# Patient Record
Sex: Female | Born: 1980 | Race: Black or African American | Hispanic: No | Marital: Single | Smoking: Never smoker
Health system: Southern US, Community
[De-identification: ages and names within clinical notes are randomized; demographics above are authoritative.]

## PROBLEM LIST (undated history)

## (undated) DIAGNOSIS — G43909 Migraine, unspecified, not intractable, without status migrainosus: Secondary | ICD-10-CM

## (undated) DIAGNOSIS — A599 Trichomoniasis, unspecified: Secondary | ICD-10-CM

## (undated) DIAGNOSIS — Z8619 Personal history of other infectious and parasitic diseases: Secondary | ICD-10-CM

## (undated) DIAGNOSIS — N3090 Cystitis, unspecified without hematuria: Secondary | ICD-10-CM

## (undated) DIAGNOSIS — B977 Papillomavirus as the cause of diseases classified elsewhere: Secondary | ICD-10-CM

## (undated) HISTORY — PX: WISDOM TOOTH EXTRACTION: SHX21

## (undated) HISTORY — DX: Cystitis, unspecified without hematuria: N30.90

## (undated) HISTORY — PX: DILATION AND CURETTAGE OF UTERUS: SHX78

## (undated) HISTORY — DX: Personal history of other infectious and parasitic diseases: Z86.19

## (undated) HISTORY — DX: Migraine, unspecified, not intractable, without status migrainosus: G43.909

## (undated) HISTORY — PX: CERVICAL CONE BIOPSY: SUR198

---

## 2001-10-02 ENCOUNTER — Encounter: Payer: Self-pay | Admitting: Emergency Medicine

## 2001-10-02 ENCOUNTER — Emergency Department (HOSPITAL_COMMUNITY): Admission: EM | Admit: 2001-10-02 | Discharge: 2001-10-02 | Payer: Self-pay | Admitting: Emergency Medicine

## 2001-10-28 ENCOUNTER — Emergency Department (HOSPITAL_COMMUNITY): Admission: EM | Admit: 2001-10-28 | Discharge: 2001-10-28 | Payer: Self-pay | Admitting: *Deleted

## 2003-02-28 ENCOUNTER — Emergency Department (HOSPITAL_COMMUNITY): Admission: AD | Admit: 2003-02-28 | Discharge: 2003-02-28 | Payer: Self-pay | Admitting: Family Medicine

## 2004-07-09 ENCOUNTER — Emergency Department (HOSPITAL_COMMUNITY): Admission: EM | Admit: 2004-07-09 | Discharge: 2004-07-09 | Payer: Self-pay | Admitting: Family Medicine

## 2004-10-05 ENCOUNTER — Inpatient Hospital Stay (HOSPITAL_COMMUNITY): Admission: AD | Admit: 2004-10-05 | Discharge: 2004-10-05 | Payer: Self-pay | Admitting: *Deleted

## 2004-10-06 ENCOUNTER — Encounter (INDEPENDENT_AMBULATORY_CARE_PROVIDER_SITE_OTHER): Payer: Self-pay | Admitting: Specialist

## 2004-10-06 ENCOUNTER — Ambulatory Visit (HOSPITAL_COMMUNITY): Admission: AD | Admit: 2004-10-06 | Discharge: 2004-10-06 | Payer: Self-pay | Admitting: Obstetrics and Gynecology

## 2004-12-14 ENCOUNTER — Ambulatory Visit: Payer: Self-pay | Admitting: Internal Medicine

## 2004-12-20 ENCOUNTER — Ambulatory Visit: Payer: Self-pay | Admitting: Internal Medicine

## 2004-12-22 ENCOUNTER — Encounter: Admission: RE | Admit: 2004-12-22 | Discharge: 2004-12-22 | Payer: Self-pay | Admitting: Internal Medicine

## 2004-12-29 ENCOUNTER — Ambulatory Visit: Payer: Self-pay | Admitting: Internal Medicine

## 2005-01-22 ENCOUNTER — Emergency Department (HOSPITAL_COMMUNITY): Admission: EM | Admit: 2005-01-22 | Discharge: 2005-01-22 | Payer: Self-pay | Admitting: Emergency Medicine

## 2005-02-08 ENCOUNTER — Ambulatory Visit (HOSPITAL_BASED_OUTPATIENT_CLINIC_OR_DEPARTMENT_OTHER): Admission: RE | Admit: 2005-02-08 | Discharge: 2005-02-08 | Payer: Self-pay | Admitting: Internal Medicine

## 2005-02-21 ENCOUNTER — Ambulatory Visit: Payer: Self-pay | Admitting: Pulmonary Disease

## 2005-03-09 ENCOUNTER — Ambulatory Visit: Payer: Self-pay | Admitting: Internal Medicine

## 2005-04-01 ENCOUNTER — Encounter: Admission: RE | Admit: 2005-04-01 | Discharge: 2005-04-01 | Payer: Self-pay | Admitting: Internal Medicine

## 2005-04-26 ENCOUNTER — Ambulatory Visit: Payer: Self-pay | Admitting: Gastroenterology

## 2005-05-18 ENCOUNTER — Emergency Department (HOSPITAL_COMMUNITY): Admission: EM | Admit: 2005-05-18 | Discharge: 2005-05-18 | Payer: Self-pay | Admitting: Family Medicine

## 2005-06-13 ENCOUNTER — Ambulatory Visit: Payer: Self-pay | Admitting: Gastroenterology

## 2005-07-08 ENCOUNTER — Ambulatory Visit: Payer: Self-pay | Admitting: Gastroenterology

## 2005-07-29 ENCOUNTER — Ambulatory Visit: Payer: Self-pay | Admitting: Internal Medicine

## 2006-03-31 ENCOUNTER — Other Ambulatory Visit: Admission: RE | Admit: 2006-03-31 | Discharge: 2006-03-31 | Payer: Self-pay | Admitting: Family Medicine

## 2006-03-31 ENCOUNTER — Ambulatory Visit: Payer: Self-pay | Admitting: Family Medicine

## 2006-04-04 ENCOUNTER — Telehealth: Payer: Self-pay | Admitting: Family Medicine

## 2006-04-10 ENCOUNTER — Telehealth (INDEPENDENT_AMBULATORY_CARE_PROVIDER_SITE_OTHER): Payer: Self-pay | Admitting: *Deleted

## 2006-04-11 ENCOUNTER — Telehealth: Payer: Self-pay | Admitting: Family Medicine

## 2006-04-13 ENCOUNTER — Telehealth (INDEPENDENT_AMBULATORY_CARE_PROVIDER_SITE_OTHER): Payer: Self-pay | Admitting: *Deleted

## 2006-05-18 ENCOUNTER — Ambulatory Visit: Payer: Self-pay | Admitting: Family Medicine

## 2006-05-18 DIAGNOSIS — L738 Other specified follicular disorders: Secondary | ICD-10-CM

## 2006-05-19 DIAGNOSIS — L708 Other acne: Secondary | ICD-10-CM

## 2006-09-22 ENCOUNTER — Ambulatory Visit: Payer: Self-pay | Admitting: Family Medicine

## 2006-09-22 DIAGNOSIS — M25569 Pain in unspecified knee: Secondary | ICD-10-CM

## 2007-05-14 ENCOUNTER — Ambulatory Visit: Payer: Self-pay | Admitting: Family Medicine

## 2007-05-14 DIAGNOSIS — J309 Allergic rhinitis, unspecified: Secondary | ICD-10-CM | POA: Insufficient documentation

## 2007-05-14 DIAGNOSIS — R21 Rash and other nonspecific skin eruption: Secondary | ICD-10-CM | POA: Insufficient documentation

## 2007-06-07 ENCOUNTER — Telehealth: Payer: Self-pay | Admitting: Family Medicine

## 2007-06-07 ENCOUNTER — Ambulatory Visit: Payer: Self-pay | Admitting: Family Medicine

## 2007-06-07 DIAGNOSIS — R42 Dizziness and giddiness: Secondary | ICD-10-CM

## 2007-06-07 DIAGNOSIS — J45909 Unspecified asthma, uncomplicated: Secondary | ICD-10-CM | POA: Insufficient documentation

## 2007-06-07 DIAGNOSIS — B070 Plantar wart: Secondary | ICD-10-CM

## 2007-06-08 ENCOUNTER — Encounter: Payer: Self-pay | Admitting: Family Medicine

## 2007-06-11 LAB — CONVERTED CEMR LAB
BUN: 11 mg/dL (ref 6–23)
Calcium: 8.8 mg/dL (ref 8.4–10.5)
Creatinine, Ser: 0.72 mg/dL (ref 0.40–1.20)
HCT: 37.8 % (ref 36.0–46.0)
Hemoglobin: 12 g/dL (ref 12.0–15.0)
Platelets: 256 10*3/uL (ref 150–400)
RBC: 4.33 M/uL (ref 3.87–5.11)
RDW: 13.1 % (ref 11.5–15.5)

## 2007-06-13 ENCOUNTER — Telehealth (INDEPENDENT_AMBULATORY_CARE_PROVIDER_SITE_OTHER): Payer: Self-pay | Admitting: *Deleted

## 2007-08-10 ENCOUNTER — Ambulatory Visit: Payer: Self-pay | Admitting: Family Medicine

## 2007-08-10 DIAGNOSIS — M549 Dorsalgia, unspecified: Secondary | ICD-10-CM | POA: Insufficient documentation

## 2007-12-06 ENCOUNTER — Ambulatory Visit: Payer: Self-pay | Admitting: Family Medicine

## 2007-12-06 LAB — CONVERTED CEMR LAB
Bilirubin Urine: NEGATIVE
Blood in Urine, dipstick: NEGATIVE
Glucose, Urine, Semiquant: NEGATIVE
Nitrite: NEGATIVE
WBC Urine, dipstick: NEGATIVE
pH: 5.5

## 2007-12-07 ENCOUNTER — Encounter: Payer: Self-pay | Admitting: Family Medicine

## 2007-12-07 LAB — CONVERTED CEMR LAB
Chlamydia, DNA Probe: NEGATIVE
GC Probe Amp, Genital: NEGATIVE

## 2007-12-13 ENCOUNTER — Telehealth (INDEPENDENT_AMBULATORY_CARE_PROVIDER_SITE_OTHER): Payer: Self-pay | Admitting: *Deleted

## 2008-01-23 ENCOUNTER — Encounter: Payer: Self-pay | Admitting: Family Medicine

## 2008-01-23 ENCOUNTER — Ambulatory Visit: Payer: Self-pay | Admitting: Family Medicine

## 2008-01-23 DIAGNOSIS — R1031 Right lower quadrant pain: Secondary | ICD-10-CM | POA: Insufficient documentation

## 2008-01-24 LAB — CONVERTED CEMR LAB
Albumin: 4.1 g/dL (ref 3.5–5.2)
BUN: 9 mg/dL (ref 6–23)
Basophils Relative: 1 % (ref 0–1)
Glucose, Bld: 83 mg/dL (ref 70–99)
Hemoglobin: 12.4 g/dL (ref 12.0–15.0)
MCHC: 33.2 g/dL (ref 30.0–36.0)
MCV: 87.2 fL (ref 78.0–100.0)
Monocytes Absolute: 0.3 10*3/uL (ref 0.1–1.0)
Platelets: 275 10*3/uL (ref 150–400)
Potassium: 4.2 meq/L (ref 3.5–5.3)
RDW: 13.2 % (ref 11.5–15.5)
TSH: 1.619 microintl units/mL (ref 0.350–4.50)
WBC: 4.2 10*3/uL (ref 4.0–10.5)

## 2008-05-20 ENCOUNTER — Ambulatory Visit (HOSPITAL_COMMUNITY): Admission: RE | Admit: 2008-05-20 | Discharge: 2008-05-20 | Payer: Self-pay | Admitting: Obstetrics and Gynecology

## 2008-05-20 ENCOUNTER — Encounter (INDEPENDENT_AMBULATORY_CARE_PROVIDER_SITE_OTHER): Payer: Self-pay | Admitting: Obstetrics and Gynecology

## 2008-09-30 ENCOUNTER — Emergency Department (HOSPITAL_COMMUNITY): Admission: EM | Admit: 2008-09-30 | Discharge: 2008-10-01 | Payer: Self-pay | Admitting: Emergency Medicine

## 2008-10-11 ENCOUNTER — Emergency Department (HOSPITAL_COMMUNITY): Admission: EM | Admit: 2008-10-11 | Discharge: 2008-10-12 | Payer: Self-pay | Admitting: Emergency Medicine

## 2008-10-13 ENCOUNTER — Ambulatory Visit: Payer: Self-pay | Admitting: Family Medicine

## 2008-10-13 DIAGNOSIS — J019 Acute sinusitis, unspecified: Secondary | ICD-10-CM

## 2008-10-17 ENCOUNTER — Telehealth: Payer: Self-pay | Admitting: Family Medicine

## 2010-07-04 LAB — URINALYSIS, ROUTINE W REFLEX MICROSCOPIC
Bilirubin Urine: NEGATIVE
Bilirubin Urine: NEGATIVE
Glucose, UA: NEGATIVE mg/dL
Glucose, UA: NEGATIVE mg/dL
Ketones, ur: 15 mg/dL — AB
Ketones, ur: NEGATIVE mg/dL
Nitrite: NEGATIVE
Protein, ur: NEGATIVE mg/dL
Specific Gravity, Urine: 1.025 (ref 1.005–1.030)
Specific Gravity, Urine: 1.021 (ref 1.005–1.030)
Urobilinogen, UA: 0.2 mg/dL (ref 0.0–1.0)
Urobilinogen, UA: 0.2 mg/dL (ref 0.0–1.0)
pH: 6.5 (ref 5.0–8.0)
pH: 6.5 (ref 5.0–8.0)

## 2010-07-04 LAB — WET PREP, GENITAL
Clue Cells Wet Prep HPF POC: NONE SEEN
Trich, Wet Prep: NONE SEEN
WBC, Wet Prep HPF POC: NONE SEEN

## 2010-07-04 LAB — POCT I-STAT, CHEM 8
Chloride: 104 mEq/L (ref 96–112)
HCT: 39 % (ref 36.0–46.0)
Potassium: 3.7 mEq/L (ref 3.5–5.1)
Sodium: 139 mEq/L (ref 135–145)

## 2010-07-04 LAB — URINE CULTURE: Colony Count: 10000

## 2010-07-04 LAB — URINE MICROSCOPIC-ADD ON

## 2010-07-13 LAB — CBC
HCT: 39.9 % (ref 36.0–46.0)
Hemoglobin: 13 g/dL (ref 12.0–15.0)
MCV: 88.3 fL (ref 78.0–100.0)
RDW: 13.5 % (ref 11.5–15.5)
WBC: 5.4 10*3/uL (ref 4.0–10.5)

## 2010-07-13 LAB — URINALYSIS, ROUTINE W REFLEX MICROSCOPIC
Bilirubin Urine: NEGATIVE
Glucose, UA: NEGATIVE mg/dL
Leukocytes, UA: NEGATIVE
Specific Gravity, Urine: 1.02 (ref 1.005–1.030)

## 2010-08-10 NOTE — Op Note (Signed)
[name], [name] NO.:  [id number]   MEDICAL RECORD NO.:  [id number]          PATIENT TYPE:  AMB   LOCATION:  SDC                           FACILITY:  [facility]   PHYSICIAN:  Randye Lobo, M.D.   DATE OF BIRTH:  09-11-1980   DATE OF PROCEDURE:  05/20/2008  DATE OF DISCHARGE:                               OPERATIVE REPORT   PREOPERATIVE DIAGNOSIS:  Cervical intraepithelial neoplasia III,  carcinoma in situ.   POSTOPERATIVE DIAGNOSIS:  Cervical intraepithelial neoplasia III,  carcinoma in situ.   SURGEON:  Randye Lobo, MD   ANESTHESIA:  LMA   ESTIMATED BLOOD LOSS:  Minimal.   IV FLUIDS:  1000 mL.   COMPLICATIONS:  None.   HISTORY OF PRESENT ILLNESS:  The patient is a 30 year old gravida 1,  para 0-0-1-0 African American female with a history of abnormal Pap  smears since 2007, who now presents with a Pap smear in May 2009  documenting CIN II, CIN III, and carcinoma in situ.  The patient  underwent a colposcopy in December 2009 which was satisfactory.  She had  exocervical biopsies which were consistent with CIN I to CIN III.  Endocervical curettage at that time documented benign endocervical cells  and there was also sampling of the endometrium, which documented benign  endometrium.  In the past, the patient has declined treatment for  dysplasia and has been seeking care from multiple different providers.  It has been difficult to achieve contact with this patient and follow  through of her dysplasia.  A plan is now made to proceed with a cold  knife cone biopsy of the cervix with a fractional D&C after risks,  benefits, and alternatives have been reviewed.   FINDINGS:  Examination of the cervix under anesthesia reveals a large  cervical ectropion.  No gross lesions are appreciated of the cervix.  The uterus is sounded to approximately 6 cm.   SPECIMENS:  Three separate specimens were sent to pathology.  The cone  biopsy specimen was sent, marked  with a silk suture at the 12 o'clock  position.  Endocervical curettings were sent to pathology separately  from endometrial curettings.   PROCEDURE:  The patient was re-identified in the preoperative hold area.  She did receive Ancef 1 g IV for antibiotic prophylaxis.   In the operating room, an LMA anesthetic was administered.  The patient  was then placed in the dorsal lithotomy position.  The lower abdomen,  vagina, and perineum were then sterilely prepped and draped and the  bladder was catheterized with an in-and-out catheter.   A weighted speculum was placed inside the vagina, dever retractors were  used to retract the bladder and lateral vaginal walls for exposure to  the cervix.  The cervix was circumferentially injected with 1% lidocaine  with 1:200,000 of epinephrine.  A figure-of-eight sutures of 0 chromic  were placed at the 3 o'clock and 9 o'clock positions of the cervix in  order to control bleeding from branches of the uterine artery.  The  suture did pull through on the patient's left-hand side and a second  suture  of 0 chromic was, therefore, placed again in this location.   Lugols solution was then used to stain the cervix.  A large ectropion  was appreciated again.  The scalpel on the angled scalpel holder was  then used to perform the cone biopsy and the specimen was set aside and  left marked at the 12 o'clock position.  The Kevorkian curette was used  to curette the endocervix and the specimen was sent to pathology.  The  uterus was sounded at this time.  The Kevorkian curette was also used to  curette the endometrium and this specimen was sent to pathology as well.  Monopolar cautery on a ball was then used to cauterize the bed of the  cone biopsy and also to cauterize the perimeter of the treated area on  the exocervix.  A Sturmdorf suture was placed at the 12 o'clock and the  6 o'clock position. There was some bleeding still deep in the bed of the  cone  biopsy and this responded to a combination of monopolar cautery,  Monsel solution, pressure, and then a small piece of Gelfoam.   With excellent hemostasis at the termination of the procedure, the  patient was awakened and escorted to the recovery room in stable  condition.   There were no complications to the procedure.  All needle, instrument,  sponge counts were correct.      Randye Lobo, M.D.  Electronically Signed     BES/MEDQ  D:  05/20/2008  T:  05/21/2008  Job:  [id number]

## 2010-08-13 NOTE — Procedures (Signed)
NAMECARESSA, SCEARCE NO.:  [id number]   MEDICAL RECORD NO.:  [id number]          PATIENT TYPE:  OUT   LOCATION:  SLEEP CENTER                 FACILITY:  [facility]   PHYSICIAN:  Marcelyn Bruins, M.D. [facility] DATE OF BIRTH:  04-04-1980   DATE OF STUDY:  02/08/2005                              NOCTURNAL POLYSOMNOGRAM   REFERRING PHYSICIAN:  Dr. 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 Epworth score: 12.   SLEEP ARCHITECTURE:  The patient had total sleep time of 316 minutes with  very little REM and slow wave sleep. Sleep onset latency was normal and REM  onset was fairly rapid at 35 minutes. Sleep efficiency was 89%.   RESPIRATORY DATA:  The patient was found to have no obstructive events and  only one central event, therefore the patient did not meet split night  protocol. There was very minimal intermittent snoring noted.   OXYGEN DATA:  The patient had a transient O2 desaturation as low as 90%.   CARDIAC DATA:  No clinically significant cardiac arrhythmias.   MOVEMENT/PARASOMNIAS:  The patient was found to have 22 leg jerks with one  per hour resulting in arousal or awakening. This is really not clinically  significant. There was an area occurring approximately 12:21 a.m. where high  amplitude EEG was noted in a rhythmic fashion that was somewhat concerning  for possible seizure. This is definitely not definitive.   IMPRESSION/RECOMMENDATION:  1.  No clinically significant sleep disordered breathing is noted.  2.  Questionable seizure activity noted during the study that is not classic      for spike and wave activity. I would recommend a sleep deprived EEG for      completeness.  3.  There is nothing obvious on the patient's sleep study to explain why she      may have difficulties with sleepiness      during the day. The patient did have rapid onset of rapid eye  movement      which might suggest the possibility of narcolepsy. Clinical correlation      is suggested.           ______________________________  Marcelyn Bruins, M.D. [facility]  Diplomate, American Board of Sleep  Medicine     KC/MEDQ  D:  02/18/2005 14:18:54  T:  02/18/2005 19:15:09  Job:  [id number]

## 2010-08-13 NOTE — Op Note (Signed)
[name], [name] NO.:  [id number]   MEDICAL RECORD NO.:  [id number]          PATIENT TYPE:  AMB   LOCATION:  [facility]                          FACILITY:  [facility]   PHYSICIAN:  Richardean Sale, M.D.   DATE OF BIRTH:  1981-02-23   DATE OF PROCEDURE:  10/06/2004  DATE OF DISCHARGE:                                 OPERATIVE REPORT   PREOPERATIVE DIAGNOSIS:  Incomplete abortion.   POSTOPERATIVE DIAGNOSIS:  Incomplete abortion.   PROCEDURE:  Dilation and evacuation of the uterus by suction curettage.   SURGEON:  Richardean Sale, M.D.   ASSISTANT:  None.   ANESTHESIA:  Conscious sedation with paracervical block.   ESTIMATED BLOOD LOSS:  Minimal.   FINDINGS:  A 10-week size uterus with products of conception.   COMPLICATIONS:  None.   SPECIMENS:  Products of conception sent to pathology.   INDICATIONS:  This is a 30 year old gravida 1, para 0, black female who was  diagnosed with a missed AB on October 05, 2004.  The patient estimated she was  at 12 weeks' gestation by her last menstrual period.  She presented with  bleeding and cramping.  Ultrasound showed a significant amount of debris in  the intrauterine gestational sac with a seven-week size fetal pole with no  cardiac activity.  The patient was scheduled for a D&E on October 08, 2004, but  represented on October 06, 2004, complaining of increased vaginal bleeding and  cramping.  On speculum examination, there was products of conception present  at the cervical os.  An attempt was made to retrieve the products of  conception in the examination room but was unsuccessful; therefore, the  patient was counseled on a recommendation for proceeding with a D&E given  incomplete AB.  Prior to the procedure, the risks, benefits, and  alternatives of the procedure were given to the patient in detail.  We  discussed the risks of bleeding, infection, injury to the uterus, possible  additional surgery and the possibility of  infertility in the future.  The  patient voiced understanding of all these risks and agreed to proceed.  Informed consent was obtained and all questions were answered.   PROCEDURE:  The patient was taken to the operating room, where she was given  intravenous sedation.  She was then prepped and draped in the usual sterile  fashion in the dorsal lithotomy position.  A bimanual exam confirmed the  presence of a nine to 10-week size uterus which was anteverted and mobile.  No obvious masses.  Adnexa were not palpable.  A red rubber catheter was  used to drain the bladder.  A speculum was then placed in the vagina and a  paracervical block was administered using a total of 18 mL of 1% Nesacaine.  The cervix was noted to be rugated with an excessive amount of ectropion,  but there were no obvious lesions.  A single-tooth tenaculum was used to  grasp the anterior lip of the cervix.  The uterus sounded to 9.5 cm.  The  cervix was then very gently dilated with Hegar dilators and the #9 mm  suction  curette was then introduced.  A moderate amount of products of  conception were easily obtained.  This was followed by sharp curettage.  Additional products of conception were present; therefore, suction was  repeated and again followed by sharp curettage until a gritty texture was  noted in all four quadrants in the uterine cavity.  All specimens were sent  to pathology.  The patient tolerated the procedure well with minimal  bleeding.  The single-tooth tenaculum was removed from the cervix.  Silver  nitrate was used to cauterize the tenaculum site.  The speculum was then  removed.  Bimanual exam was performed.  The uterus was small, mobile,  without any masses.  The patient was then taken out of the dorsal lithotomy  position and was transferred to the recovery room awake, in stable  condition.  There were no complications.       JW/MEDQ  D:  10/06/2004  T:  10/07/2004  Job:  [id number]

## 2011-03-04 ENCOUNTER — Emergency Department (INDEPENDENT_AMBULATORY_CARE_PROVIDER_SITE_OTHER)
Admission: EM | Admit: 2011-03-04 | Discharge: 2011-03-04 | Disposition: A | Discharge status: Home / Self Care | Payer: 59 | Source: Home / Self Care | Attending: Family Medicine | Admitting: Family Medicine

## 2011-03-04 ENCOUNTER — Encounter: Payer: Self-pay | Admitting: *Deleted

## 2011-03-04 DIAGNOSIS — R109 Unspecified abdominal pain: Secondary | ICD-10-CM

## 2011-03-04 HISTORY — DX: Trichomoniasis, unspecified: A59.9

## 2011-03-04 HISTORY — DX: Papillomavirus as the cause of diseases classified elsewhere: B97.7

## 2011-03-04 LAB — POCT URINALYSIS DIP (DEVICE)
Ketones, ur: NEGATIVE mg/dL
Leukocytes, UA: NEGATIVE
Protein, ur: NEGATIVE mg/dL
pH: 6 (ref 5.0–8.0)

## 2011-03-04 MED ORDER — HYDROCODONE-ACETAMINOPHEN 5-500 MG PO TABS: 1.0000 | ORAL_TABLET | Freq: Four times a day (QID) | ORAL | Status: AC | PRN

## 2011-03-04 NOTE — ED Notes (Addendum)
C/O intermittent RLQ pain over past several months; over past 5 days pain has been constant with some pain in left low back.  Does not have menstrual cycles due to Depo Provera.  Has some occasional nausea; no vomiting, no diarrhea, no fevers this week.  Reports more vaginal discharge than usual earlier this week.  Also c/o parasthesias to left upper arm and left upper thigh at times.

## 2011-03-04 NOTE — ED Notes (Signed)
Pelvic exam completed per Dr. Alfonse Ras

## 2011-03-04 NOTE — ED Provider Notes (Signed)
History     CSN: [id number] Arrival date & time: 03/04/2011  7:57 PM   First MD Initiated Contact with Patient 03/04/11 1844      Chief Complaint  Patient presents with  . Abdominal Pain    (Consider location/radiation/quality/duration/timing/severity/associated sxs/prior treatment) HPI Comments: Was treated 3 weeks ago for trichomonas here c/o low abdominal pain in left side for 5 days constant. No nausea vomiting or constipation. No dysuria has had left flank and low back pain with this as well, but no hematuria and no frequency. Does not have periods as she is on Depo provera has not missed a shot. Last unprotected sex in October prior last pelvic exam.. Also states she had vaginal discharge for 1 day which is resolved now. No pelvic pain. Denies vaginal bleeding. No headache or moliminal symptoms. Had soft stools this am with last BM.   Past Medical History  Diagnosis Date  . Trichomonas   . HPV in female   . Asthma     Past Surgical History  Procedure Date  . Cervical cone biopsy   . Dilation and curettage of uterus     History reviewed. No pertinent family history.  History  Substance Use Topics  . Smoking status: Never Smoker   . Smokeless tobacco: Not on file  . Alcohol Use: Yes     occasional    OB History    Grav Para Term Preterm Abortions TAB SAB Ect Mult Living                  Review of Systems  Constitutional: Negative.   HENT: Negative for congestion and rhinorrhea.   Respiratory: Negative for cough.   Gastrointestinal: Positive for abdominal pain. Negative for nausea, vomiting, diarrhea, constipation and blood in stool.  Genitourinary: Positive for flank pain. Negative for dysuria, urgency, frequency, hematuria, vaginal bleeding and pelvic pain.  Neurological: Negative for dizziness and headaches.    Allergies  Codeine and Shellfish allergy  Home Medications   Current Outpatient Rx  Name Route Sig Dispense Refill  . ALBUTEROL IN  Inhalation Inhale into the lungs as needed.      Marland Kitchen VITAMIN C PO Oral Take by mouth.      Marland Kitchen DEPO-PROVERA IM Intramuscular Inject into the muscle.      . MULTIVITAMIN PO Oral Take by mouth.      Marland Kitchen FISH OIL PO Oral Take by mouth.      Marland Kitchen UNKNOWN TO PATIENT  Sinus cold med     . HYDROCODONE-ACETAMINOPHEN 5-500 MG PO TABS Oral Take 1-2 tablets by mouth every 6 (six) hours as needed for pain. 15 tablet 0    BP 107/71  Pulse 85  Temp(Src) 98.1 F (36.7 C) (Oral)  Resp 20  SpO2 100%  Physical Exam  Nursing note and vitals reviewed. Constitutional: She appears well-developed and well-nourished. No distress.  HENT:  Mouth/Throat: Oropharynx is clear and moist. No oropharyngeal exudate.  Eyes: Conjunctivae are normal. Pupils are equal, round, and reactive to light. No scleral icterus.  Neck: Neck supple.  Cardiovascular: Normal rate, regular rhythm and normal heart sounds.   No murmur heard. Pulmonary/Chest: Breath sounds normal.  Abdominal: Soft. Bowel sounds are normal. She exhibits no distension and no mass. There is no tenderness. There is no rebound and no guarding. Hernia confirmed negative in the right inguinal area and confirmed negative in the left inguinal area.  Genitourinary: Vagina normal and uterus normal. There is no rash or lesion on the  right labia. There is no rash or lesion on the left labia. Cervix exhibits no motion tenderness and no discharge. Right adnexum displays no mass, no tenderness and no fullness. Left adnexum displays no mass, no tenderness and no fullness. No erythema or bleeding around the vagina.  Lymphadenopathy:    She has no cervical adenopathy.       Right: No inguinal adenopathy present.       Left: No inguinal adenopathy present.    ED Course  Procedures (including critical care time)  Labs Reviewed  WET PREP, GENITAL - Abnormal; Notable for the following:    Clue Cells, Wet Prep MODERATE (*)    WBC, Wet Prep HPF POC FEW (*)    All other components  within normal limits  POCT URINALYSIS DIP (DEVICE) - Abnormal; Notable for the following:    Hgb urine dipstick SMALL (*)    All other components within normal limits  GC/CHLAMYDIA PROBE AMP, GENITAL  POCT PREGNANCY, URINE  LAB REPORT - SCANNED   No results found.   1. Flank pain       MDM  No significant or bothersome vaginal discharge and no pelvic pain. Pt had small blood in urine making it possible she might have passed a kidney stone. No current significant symptoms for obstructive disease as (No nausea, vomiting and pt is comfortable on exam) Treated symptomatically asked to go to the emergency department if recurrent or worsening pain, fever etc.        Sharin Grave, MD 03/08/11 [phone]

## 2011-03-07 ENCOUNTER — Telehealth (HOSPITAL_COMMUNITY): Payer: Self-pay | Admitting: *Deleted

## 2011-03-07 NOTE — ED Notes (Signed)
Labs and medications on 12/7 reviewed. Pt. needs tx. for clue cells on wet prep. Message sent to Dr. Janeann Forehand in basket. Vassie Moselle 03/07/2011

## 2011-03-07 NOTE — ED Notes (Signed)
Mod. Clue cells on Wet prep. Order obtained from [facility] for Flagyl 500 mg. #14 1 BID x 7 days no refills. Vassie Moselle 03/07/2011

## 2011-03-08 ENCOUNTER — Emergency Department (HOSPITAL_COMMUNITY): Payer: 59

## 2011-03-08 ENCOUNTER — Emergency Department (HOSPITAL_COMMUNITY)
Admission: EM | Admit: 2011-03-08 | Discharge: 2011-03-08 | Disposition: A | Discharge status: Home / Self Care | Payer: 59 | Attending: Emergency Medicine | Admitting: Emergency Medicine

## 2011-03-08 ENCOUNTER — Telehealth (HOSPITAL_COMMUNITY): Payer: Self-pay | Admitting: *Deleted

## 2011-03-08 ENCOUNTER — Encounter (HOSPITAL_COMMUNITY): Payer: Self-pay | Admitting: Emergency Medicine

## 2011-03-08 DIAGNOSIS — M549 Dorsalgia, unspecified: Secondary | ICD-10-CM | POA: Insufficient documentation

## 2011-03-08 DIAGNOSIS — R109 Unspecified abdominal pain: Secondary | ICD-10-CM | POA: Insufficient documentation

## 2011-03-08 LAB — URINALYSIS, ROUTINE W REFLEX MICROSCOPIC
Bilirubin Urine: NEGATIVE
Specific Gravity, Urine: 1.009 (ref 1.005–1.030)
Urobilinogen, UA: 0.2 mg/dL (ref 0.0–1.0)

## 2011-03-08 LAB — URINE MICROSCOPIC-ADD ON

## 2011-03-08 MED ORDER — HYDROCODONE-ACETAMINOPHEN 5-500 MG PO TABS: 1.0000 | ORAL_TABLET | Freq: Four times a day (QID) | ORAL | Status: AC | PRN

## 2011-03-08 NOTE — ED Provider Notes (Addendum)
History     CSN: [id number] Arrival date & time: 03/08/2011  7:45 PM   First MD Initiated Contact with Patient 03/08/11 2107      Chief Complaint  Patient presents with  . Abdominal Pain  . Back Pain    (Consider location/radiation/quality/duration/timing/severity/associated sxs/prior treatment) HPI Comments: History of chronic abd pain.  Was seen by pcp and had pelvic exam several days ago.  This was okay.  Returns with continued pain.    Patient is a 30 y.o. female presenting with abdominal pain. The history is provided by the patient.  Abdominal Pain The primary symptoms of the illness include abdominal pain. The primary symptoms of the illness do not include fever, nausea, vomiting or diarrhea. The current episode started more than 2 days ago. The onset of the illness was gradual. The problem has been gradually worsening.  The patient states that she believes she is currently not pregnant. The patient has not had a change in bowel habit. Symptoms associated with the illness do not include chills, anorexia, constipation, urgency or frequency.    Past Medical History  Diagnosis Date  . Trichomonas   . HPV in female   . Asthma     Past Surgical History  Procedure Date  . Cervical cone biopsy   . Dilation and curettage of uterus     Family History  Problem Relation Age of Onset  . Hypertension Other   . Cancer Other     History  Substance Use Topics  . Smoking status: Never Smoker   . Smokeless tobacco: Not on file  . Alcohol Use: Yes     occasional    OB History    Grav Para Term Preterm Abortions TAB SAB Ect Mult Living                  Review of Systems  Constitutional: Negative for fever and chills.  Gastrointestinal: Positive for abdominal pain. Negative for nausea, vomiting, diarrhea, constipation and anorexia.  Genitourinary: Negative for urgency and frequency.  All other systems reviewed and are negative.    Allergies  Codeine and Shellfish  allergy  Home Medications   Current Outpatient Rx  Name Route Sig Dispense Refill  . ALBUTEROL SULFATE HFA 108 (90 BASE) MCG/ACT IN AERS Inhalation Inhale 2 puffs into the lungs every 6 (six) hours as needed.      Marland Kitchen VITAMIN C PO Oral Take 1 tablet by mouth daily.     Marland Kitchen FLUCONAZOLE 150 MG PO TABS Oral Take 150 mg by mouth once. Took for three days last week     . HYDROCODONE-ACETAMINOPHEN 5-500 MG PO TABS Oral Take 1-2 tablets by mouth every 6 (six) hours as needed for pain. 15 tablet 0  . LEVOCETIRIZINE DIHYDROCHLORIDE 5 MG PO TABS Oral Take 5 mg by mouth every evening.      Marland Kitchen METRONIDAZOLE 250 MG PO TABS Oral Take 250 mg by mouth 3 (three) times daily. Finished taking the weekly amount given to treat BV     . MOMETASONE FUROATE 50 MCG/ACT NA SUSP Nasal Place 2 sprays into the nose daily.      . MULTIVITAMIN PO Oral Take 1 tablet by mouth daily.     Marland Kitchen FISH OIL PO Oral Take 1 tablet by mouth daily.     Marland Kitchen DEPO-PROVERA IM Intramuscular Inject into the muscle.        BP 113/72  Pulse 75  Temp(Src) 98.2 F (36.8 C) (Oral)  Resp 18  SpO2 100%  Physical Exam  Nursing note and vitals reviewed. Constitutional: She is oriented to person, place, and time. She appears well-developed and well-nourished. No distress.  HENT:  Head: Normocephalic and atraumatic.  Neck: Normal range of motion. Neck supple.  Cardiovascular: Normal rate and regular rhythm.  Exam reveals no gallop and no friction rub.   No murmur heard. Pulmonary/Chest: Effort normal and breath sounds normal. No respiratory distress. She has no wheezes.  Abdominal: Soft. Bowel sounds are normal. She exhibits no distension.       There is ttp in the llq without rebound or guarding.    Musculoskeletal: Normal range of motion.  Neurological: She is alert and oriented to person, place, and time.  Skin: Skin is warm and dry. She is not diaphoretic.    ED Course  Procedures (including critical care time)  Labs Reviewed    URINALYSIS, ROUTINE W REFLEX MICROSCOPIC - Abnormal; Notable for the following:    Hgb urine dipstick SMALL (*)    Ketones, ur 15 (*)    All other components within normal limits  POCT PREGNANCY, URINE  URINE MICROSCOPIC-ADD ON  POCT PREGNANCY, URINE   Ct Abdomen Pelvis Wo Contrast  03/08/2011  *RADIOLOGY REPORT*  Clinical Data: Sided pain.  Question kidney stones.  CT ABDOMEN AND PELVIS WITHOUT CONTRAST  Technique:  Multidetector CT imaging of the abdomen and pelvis was performed following the standard protocol without intravenous contrast.  Comparison: 04/01/2005  Findings: The liver and spleen have normal uninfused features.  The stomach, duodenum, pancreas, gallbladder, and adrenal glands are unremarkable.  The kidneys have normal imaging features. Specifically, there is no evidence for renal stones or secondary changes in either kidney.  No evidence for ureteral or bladder stones.  No abdominal aortic aneurysm.  No free fluid or lymphadenopathy.  Imaging through the pelvis also shows no free fluid.  There is no pelvic sidewall lymphadenopathy.  Uterus is unremarkable.  No adnexal mass.  No colonic diverticulitis.  Terminal ileum is normal.  The appendix is normal.  Bone windows reveal no worrisome lytic or sclerotic osseous lesions.  IMPRESSION: Normal uninfused CT scan of the abdomen and pelvis.  Original Report Authenticated By: ERIC A. MANSELL, M.D.     No diagnosis found.    MDM  CT, urine looks okay.  No cause for the patient's symptoms found.  Will discharge to home with a few pain meds, nsaids and follow up as needed.  Patient had pelvic exam on Friday for workup of similar complaints.        Geoffery Lyons, MD 03/08/11 2210  Geoffery Lyons, MD 04/25/11 [phone]

## 2011-03-08 NOTE — ED Notes (Signed)
Pt. Diagnosed with bacterial vaginosis today but has not picked up medicine yet.

## 2011-03-08 NOTE — ED Notes (Signed)
Dawn said to tell pt. if she took medicine over a short period of time, she would have to take a higher dose which would be more likely to cause nausea.  Tell pt. since this is a recurring problem, she needs to get a PCP or OB-GYN. Pt. given these instructions during phone encounter. I was unable to put it all in comment section.  Pt. instructed to no alcohol while taking this medication. Vassie Moselle 03/08/2011

## 2011-03-08 NOTE — ED Notes (Signed)
Pt states she has been having pain in her left lower quadrant for about 3 mths now and has been having pain in her left lower back for about the past 2 mths  Pt states the pain has gotten worse in the last week  Pt states for the past couple days she has been feeling light headed and she had tingling in her left arm and left leg one night  Pt states her left eye started itching last night after she took a vicodin  Pt states she has been using vicodin for the past 3 days  Pt states she went to [facility] urgent care on Friday for the abd pain and was told if it did not get any better to come to the hosptial for further evaluation

## 2011-04-11 ENCOUNTER — Ambulatory Visit (INDEPENDENT_AMBULATORY_CARE_PROVIDER_SITE_OTHER): Payer: 59

## 2011-04-11 DIAGNOSIS — B373 Candidiasis of vulva and vagina: Secondary | ICD-10-CM

## 2011-04-11 DIAGNOSIS — N760 Acute vaginitis: Secondary | ICD-10-CM

## 2011-04-21 NOTE — ED Notes (Signed)
Pt. Did not have vaginitis symptoms. No need to treat unless symptomatic. ----- Message ----- From: Vassie Moselle, RN Sent: 03/07/2011 2:16 PM To: Sharin Grave, MD Subject: Needs order   Pt. has mod. Clue cells on Wet prep. I did not see an order for Flagyl.

## 2011-05-04 ENCOUNTER — Emergency Department (HOSPITAL_COMMUNITY): Payer: 59

## 2011-05-04 ENCOUNTER — Encounter (HOSPITAL_COMMUNITY): Payer: Self-pay

## 2011-05-04 ENCOUNTER — Other Ambulatory Visit: Payer: Self-pay

## 2011-05-04 ENCOUNTER — Emergency Department (HOSPITAL_COMMUNITY)
Admission: EM | Admit: 2011-05-04 | Discharge: 2011-05-04 | Disposition: A | Discharge status: Home / Self Care | Payer: 59 | Attending: Emergency Medicine | Admitting: Emergency Medicine

## 2011-05-04 DIAGNOSIS — R0789 Other chest pain: Secondary | ICD-10-CM

## 2011-05-04 DIAGNOSIS — Z79899 Other long term (current) drug therapy: Secondary | ICD-10-CM | POA: Insufficient documentation

## 2011-05-04 DIAGNOSIS — M25519 Pain in unspecified shoulder: Secondary | ICD-10-CM | POA: Insufficient documentation

## 2011-05-04 DIAGNOSIS — J45909 Unspecified asthma, uncomplicated: Secondary | ICD-10-CM | POA: Insufficient documentation

## 2011-05-04 DIAGNOSIS — R071 Chest pain on breathing: Secondary | ICD-10-CM | POA: Insufficient documentation

## 2011-05-04 DIAGNOSIS — M549 Dorsalgia, unspecified: Secondary | ICD-10-CM | POA: Insufficient documentation

## 2011-05-04 DIAGNOSIS — K30 Functional dyspepsia: Secondary | ICD-10-CM

## 2011-05-04 DIAGNOSIS — R1013 Epigastric pain: Secondary | ICD-10-CM | POA: Insufficient documentation

## 2011-05-04 LAB — URINALYSIS, ROUTINE W REFLEX MICROSCOPIC
Bilirubin Urine: NEGATIVE
Glucose, UA: NEGATIVE mg/dL
Hgb urine dipstick: NEGATIVE
Ketones, ur: NEGATIVE mg/dL
Leukocytes, UA: NEGATIVE
Nitrite: NEGATIVE
Protein, ur: NEGATIVE mg/dL
Specific Gravity, Urine: 1.019 (ref 1.005–1.030)
Urobilinogen, UA: 0.2 mg/dL (ref 0.0–1.0)
pH: 7.5 (ref 5.0–8.0)

## 2011-05-04 LAB — POCT I-STAT, CHEM 8
Potassium: 4.2 mEq/L (ref 3.5–5.1)
Sodium: 140 mEq/L (ref 135–145)
TCO2: 25 mmol/L (ref 0–100)

## 2011-05-04 LAB — TROPONIN I: Troponin I: <0.3 ng/mL (ref <0.30)

## 2011-05-04 LAB — POCT PREGNANCY, URINE: Preg Test, Ur: NEGATIVE

## 2011-05-04 MED ORDER — GI COCKTAIL ~~LOC~~
30.0000 mL | Freq: Once | ORAL | Status: AC
  Administered 2011-05-04: 30 mL via ORAL
  Filled 2011-05-04: qty 30

## 2011-05-04 NOTE — ED Provider Notes (Signed)
History     CSN: [id number]  Arrival date & time 05/04/11  0325   First MD Initiated Contact with Patient 05/04/11 [phone]      Chief Complaint  Patient presents with  . Chest Pain    HPI: Patient is a 31 y.o. female presenting with chest pain.  Chest Pain The chest pain began 1 - 2 weeks ago. Chest pain occurs constantly. The chest pain is unchanged. The pain is currently at 5/10. The severity of the pain is moderate. The quality of the pain is described as sharp. The pain radiates to the upper back and right shoulder. Chest pain is worsened by certain positions. Primary symptoms include abdominal pain. Pertinent negatives for primary symptoms include no fever, no shortness of breath, no cough, no palpitations, no nausea and no vomiting. She tried NSAIDs for the symptoms. Risk factors include no known risk factors.  Pertinent negatives for family medical history include: no CAD in family.   Pt reports approx 1 week hx of (R) sided CP that radiated to (R) shoulder and (R) upper back. States pain has been constant though intensity varies, and is not associated w/ other symptoms. Pt admits she started a new exercise program approx 2 weeks ago prior to onset of pain. Was seen at an Urgent Care Monday and put on Ibuprofen which she states does not seem to be helping. Pt also reports she has had increased indigestion over the last few days. States this has never been a problem for her but recently just about everything she eats causes the epigastric "burning"  Past Medical History  Diagnosis Date  . Trichomonas   . HPV in female   . Asthma     Past Surgical History  Procedure Date  . Cervical cone biopsy   . Dilation and curettage of uterus     Family History  Problem Relation Age of Onset  . Hypertension Other   . Cancer Other     History  Substance Use Topics  . Smoking status: Never Smoker   . Smokeless tobacco: Not on file  . Alcohol Use: Yes     occasional    OB History      Grav Para Term Preterm Abortions TAB SAB Ect Mult Living                  Review of Systems  Constitutional: Negative.  Negative for fever.  HENT: Negative.   Eyes: Negative.   Respiratory: Negative.  Negative for cough and shortness of breath.   Cardiovascular: Positive for chest pain. Negative for palpitations.  Gastrointestinal: Positive for abdominal pain. Negative for nausea and vomiting.  Genitourinary: Negative.   Musculoskeletal: Negative.   Skin: Negative.   Neurological: Negative.   Hematological: Negative.   Psychiatric/Behavioral: Negative.     Allergies  Codeine and Shellfish allergy  Home Medications   Current Outpatient Rx  Name Route Sig Dispense Refill  . ALBUTEROL SULFATE HFA 108 (90 BASE) MCG/ACT IN AERS Inhalation Inhale 2 puffs into the lungs every 6 (six) hours as needed.      Marland Kitchen VITAMIN C PO Oral Take 1 tablet by mouth daily.     Marland Kitchen CALCIUM CARBONATE ANTACID 500 MG PO CHEW Oral Chew 1 tablet by mouth daily.    Marland Kitchen LEVOCETIRIZINE DIHYDROCHLORIDE 5 MG PO TABS Oral Take 5 mg by mouth every evening.      . MULTIVITAMIN PO Oral Take 1 tablet by mouth daily.     Marland Kitchen FISH  OIL PO Oral Take 1 tablet by mouth daily.       BP 133/84  Pulse 79  Temp(Src) 99.3 F (37.4 C) (Oral)  Resp 18  Wt 193 lb (87.544 kg)  SpO2 100%  Physical Exam  Constitutional: She is oriented to person, place, and time. She appears well-developed and well-nourished.  HENT:  Head: Normocephalic and atraumatic.  Eyes: Conjunctivae are normal.  Neck: Neck supple.  Cardiovascular: Normal rate and regular rhythm.   Pulmonary/Chest: Effort normal and breath sounds normal.           (R) chest wall and (R) upper back TTP  Abdominal: Soft. Bowel sounds are normal.  Musculoskeletal: Normal range of motion.  Neurological: She is alert and oriented to person, place, and time.  Skin: Skin is warm and dry. No erythema.  Psychiatric: She has a normal mood and affect.    ED Course   Procedures   Findings and clinical impressions discussed w/ pt. Will plan for d/c home w/ instructions to continue Ibuprofin and to refrain from strenuous work-outs for several days. Will encourage her to try OTC daily medication for her recent dyspepsia and to arrange close f/u w/ her PCP for recheck. Pt is agreeable w/ plan. I have also discussed pt w. Dr Rubin Payor who is also agreeable w/ plan.  Labs Reviewed - No data to display No results found.   No diagnosis found.    MDM  HPI/PE and clinical findings c/w  1. Musculoskeletal chest wall pain associated w/ recent exercise program (CXR, EKG and Trop I w/o acute findings, 31 y/o w/ hx asthma,  otherwise healthy w/o known risk factors). 2. Dyspepsia         Leanne Chang, NP 05/07/11 1253

## 2011-05-04 NOTE — ED Notes (Signed)
MD at bedside. 

## 2011-05-04 NOTE — ED Notes (Signed)
Pt complains of a throbbing, aching right sided chest pain on and off all day but worse tonight

## 2011-05-07 NOTE — ED Provider Notes (Signed)
Medical screening examination/treatment/procedure(s) were performed by non-physician practitioner and as supervising physician I was immediately available for consultation/collaboration.  Juliet Rude. Rubin Payor, MD 05/07/11 1620

## 2011-06-07 ENCOUNTER — Other Ambulatory Visit: Payer: Self-pay | Admitting: Obstetrics and Gynecology

## 2011-06-07 DIAGNOSIS — N644 Mastodynia: Secondary | ICD-10-CM

## 2011-06-13 ENCOUNTER — Other Ambulatory Visit: Payer: Self-pay | Admitting: Obstetrics and Gynecology

## 2011-06-13 ENCOUNTER — Ambulatory Visit
Admission: RE | Admit: 2011-06-13 | Discharge: 2011-06-13 | Disposition: A | Discharge status: Home / Self Care | Payer: 59 | Source: Ambulatory Visit | Attending: Obstetrics and Gynecology | Admitting: Obstetrics and Gynecology

## 2011-06-13 DIAGNOSIS — N644 Mastodynia: Secondary | ICD-10-CM

## 2011-07-18 ENCOUNTER — Emergency Department (HOSPITAL_COMMUNITY): Payer: 59

## 2011-07-18 ENCOUNTER — Emergency Department (HOSPITAL_COMMUNITY)
Admission: EM | Admit: 2011-07-18 | Discharge: 2011-07-18 | Disposition: A | Discharge status: Home / Self Care | Payer: 59 | Attending: Emergency Medicine | Admitting: Emergency Medicine

## 2011-07-18 ENCOUNTER — Encounter (HOSPITAL_COMMUNITY): Payer: Self-pay

## 2011-07-18 DIAGNOSIS — J45909 Unspecified asthma, uncomplicated: Secondary | ICD-10-CM | POA: Insufficient documentation

## 2011-07-18 DIAGNOSIS — R10813 Right lower quadrant abdominal tenderness: Secondary | ICD-10-CM | POA: Insufficient documentation

## 2011-07-18 DIAGNOSIS — R0989 Other specified symptoms and signs involving the circulatory and respiratory systems: Secondary | ICD-10-CM | POA: Insufficient documentation

## 2011-07-18 DIAGNOSIS — R079 Chest pain, unspecified: Secondary | ICD-10-CM | POA: Insufficient documentation

## 2011-07-18 DIAGNOSIS — R0602 Shortness of breath: Secondary | ICD-10-CM | POA: Insufficient documentation

## 2011-07-18 DIAGNOSIS — R0609 Other forms of dyspnea: Secondary | ICD-10-CM | POA: Insufficient documentation

## 2011-07-18 LAB — PREGNANCY, URINE: Preg Test, Ur: NEGATIVE

## 2011-07-18 LAB — COMPREHENSIVE METABOLIC PANEL
ALT: 11 U/L (ref 0–35)
AST: 16 U/L (ref 0–37)
CO2: 27 mEq/L (ref 19–32)
Calcium: 9.1 mg/dL (ref 8.4–10.5)
Sodium: 138 mEq/L (ref 135–145)
Total Protein: 7.1 g/dL (ref 6.0–8.3)

## 2011-07-18 LAB — CBC
HCT: 37.4 % (ref 36.0–46.0)
Hemoglobin: 12.4 g/dL (ref 12.0–15.0)
RBC: 4.29 MIL/uL (ref 3.87–5.11)
RDW: 13.1 % (ref 11.5–15.5)
WBC: 6.2 10*3/uL (ref 4.0–10.5)

## 2011-07-18 LAB — LIPASE, BLOOD: Lipase: 41 U/L (ref 11–59)

## 2011-07-18 LAB — DIFFERENTIAL
Basophils Absolute: 0 10*3/uL (ref 0.0–0.1)
Lymphocytes Relative: 40 % (ref 12–46)
Lymphs Abs: 2.4 10*3/uL (ref 0.7–4.0)
Monocytes Absolute: 0.5 10*3/uL (ref 0.1–1.0)
Neutro Abs: 3.1 10*3/uL (ref 1.7–7.7)

## 2011-07-18 LAB — CK TOTAL AND CKMB (NOT AT ARMC)
Relative Index: 0.7 (ref 0.0–2.5)
Total CK: 205 U/L — ABNORMAL HIGH (ref 7–177)

## 2011-07-18 LAB — PROTIME-INR
INR: 0.98 (ref 0.00–1.49)
Prothrombin Time: 13.2 seconds (ref 11.6–15.2)

## 2011-07-18 MED ORDER — IBUPROFEN 800 MG PO TABS
800.0000 mg | ORAL_TABLET | Freq: Once | ORAL | Status: AC
  Administered 2011-07-18: 800 mg via ORAL

## 2011-07-18 MED ORDER — IBUPROFEN 800 MG PO TABS
ORAL_TABLET | ORAL | Status: AC
  Administered 2011-07-18: 800 mg via ORAL
  Filled 2011-07-18: qty 1

## 2011-07-18 NOTE — Discharge Instructions (Signed)
Chest Pain (Nonspecific) It is often hard to give a specific diagnosis for the cause of chest pain. There is always a chance that your pain could be related to something serious, such as a heart attack or a blood clot in the lungs. You need to follow up with your caregiver for further evaluation. CAUSES   Heartburn.   Pneumonia or bronchitis.   Anxiety or stress.   Inflammation around your heart (pericarditis) or lung (pleuritis or pleurisy).   A blood clot in the lung.   A collapsed lung (pneumothorax). It can develop suddenly on its own (spontaneous pneumothorax) or from injury (trauma) to the chest.   Shingles infection (herpes zoster virus).  The chest wall is composed of bones, muscles, and cartilage. Any of these can be the source of the pain.  The bones can be bruised by injury.   The muscles or cartilage can be strained by coughing or overwork.   The cartilage can be affected by inflammation and become sore (costochondritis).  DIAGNOSIS  Lab tests or other studies, such as X-rays, electrocardiography, stress testing, or cardiac imaging, may be needed to find the cause of your pain.  TREATMENT   Treatment depends on what may be causing your chest pain. Treatment may include:   Acid blockers for heartburn.   Anti-inflammatory medicine.   Pain medicine for inflammatory conditions.   Antibiotics if an infection is present.   You may be advised to change lifestyle habits. This includes stopping smoking and avoiding alcohol, caffeine, and chocolate.   You may be advised to keep your head raised (elevated) when sleeping. This reduces the chance of acid going backward from your stomach into your esophagus.   Most of the time, nonspecific chest pain will improve within 2 to 3 days with rest and mild pain medicine.  HOME CARE INSTRUCTIONS   If antibiotics were prescribed, take your antibiotics as directed. Finish them even if you start to feel better.   For the next few  days, avoid physical activities that bring on chest pain. Continue physical activities as directed.   Do not smoke.   Avoid drinking alcohol.   Only take over-the-counter or prescription medicine for pain, discomfort, or fever as directed by your caregiver.   Follow your caregiver's suggestions for further testing if your chest pain does not go away.   Keep any follow-up appointments you made. If you do not go to an appointment, you could develop lasting (chronic) problems with pain. If there is any problem keeping an appointment, you must call to reschedule.  SEEK MEDICAL CARE IF:   You think you are having problems from the medicine you are taking. Read your medicine instructions carefully.   Your chest pain does not go away, even after treatment.   You develop a rash with blisters on your chest.  SEEK IMMEDIATE MEDICAL CARE IF:   You have increased chest pain or pain that spreads to your arm, neck, jaw, back, or abdomen.   You develop shortness of breath, an increasing cough, or you are coughing up blood.   You have severe back or abdominal pain, feel nauseous, or vomit.   You develop severe weakness, fainting, or chills.   You have a fever.  THIS IS AN EMERGENCY. Do not wait to see if the pain will go away. Get medical help at once. Call your local emergency services (911 in [place] Do not drive yourself to the hospital. MAKE SURE YOU:   Understand these instructions.     Will watch your condition.   Will get help right away if you are not doing well or get worse.  Document Released: 12/22/2004 Document Revised: 03/03/2011 Document Reviewed: 10/18/2007 [employer] Patient Information 2012 [employer]. 

## 2011-07-18 NOTE — ED Notes (Signed)
Patient reports that after lunch she began having right chest pain, burping, and SOB. Patient states she has had symptoms intermittently x 1 month. Today, since 1400

## 2011-07-18 NOTE — ED Provider Notes (Signed)
History     CSN: [id number]  Arrival date & time 07/18/11  1550   First MD Initiated Contact with Patient 07/18/11 1603      Chief Complaint  Patient presents with  . Chest Pain  . Shortness of Breath    (Consider location/radiation/quality/duration/timing/severity/associated sxs/prior treatment) HPI Patient with right sided chest pain began after lunch feels like someone punching in her right upper chest.  Patient has had repeat episodes for a month.  Daily occurrence lasting until pain medicine wears off.  Initially diagnosed with bruised sternum.  Followed up at [facility] center.  Pain now worse at 10/10.  Dyspnea occurs off an on.  [facility]  No history of dvt or pe, no smoking, denies pregnancy, fever, chills, cough.  Uses inhaler with small amount of frelief.   Past Medical History  Diagnosis Date  . Trichomonas   . HPV in female   . Asthma     Past Surgical History  Procedure Date  . Cervical cone biopsy   . Dilation and curettage of uterus     Family History  Problem Relation Age of Onset  . Hypertension Other   . Cancer Other     History  Substance Use Topics  . Smoking status: Never Smoker   . Smokeless tobacco: Not on file  . Alcohol Use: Yes     occasional    OB History    Grav Para Term Preterm Abortions TAB SAB Ect Mult Living                  Review of Systems  All other systems reviewed and are negative.    Allergies  Codeine and Shellfish allergy  Home Medications   Current Outpatient Rx  Name Route Sig Dispense Refill  . ALBUTEROL SULFATE HFA 108 (90 BASE) MCG/ACT IN AERS Inhalation Inhale 2 puffs into the lungs every 6 (six) hours as needed.      Marland Kitchen VITAMIN C PO Oral Take 1 tablet by mouth daily.     Marland Kitchen CALCIUM CARBONATE ANTACID 500 MG PO CHEW Oral Chew 1 tablet by mouth daily.    Marland Kitchen LEVOCETIRIZINE DIHYDROCHLORIDE 5 MG PO TABS Oral Take 5 mg by mouth every evening.      . MULTIVITAMIN PO Oral Take 1 tablet by mouth daily.      Marland Kitchen FISH OIL PO Oral Take 1 tablet by mouth daily.       BP 132/80  Pulse 78  Temp(Src) 98.7 F (37.1 C) (Oral)  Resp 18  SpO2 100%  LMP 04/17/2011  Physical Exam  Nursing note and vitals reviewed. Constitutional: She appears well-developed and well-nourished.  HENT:  Head: Normocephalic and atraumatic.  Eyes: Conjunctivae and EOM are normal. Pupils are equal, round, and reactive to light.  Neck: Normal range of motion. Neck supple.  Cardiovascular: Normal rate, regular rhythm, normal heart sounds and intact distal pulses.   Pulmonary/Chest: Effort normal and breath sounds normal.       Right chest wall tenderness with palp  Abdominal: Soft. Bowel sounds are normal.       Mild ruq tenderness  Musculoskeletal: Normal range of motion.  Neurological: She is alert.  Skin: Skin is warm and dry.  Psychiatric: She has a normal mood and affect. Thought content normal.    ED Course  Procedures (including critical care time)  Labs Reviewed - No data to display No results found.   No diagnosis found. Results for orders placed during the  hospital encounter of 07/18/11  CBC      Component Value Range   WBC 6.2  4.0 - 10.5 (K/uL)   RBC 4.29  3.87 - 5.11 (MIL/uL)   Hemoglobin 12.4  12.0 - 15.0 (g/dL)   HCT 16.1  09.6 - 04.5 (%)   MCV 87.2  78.0 - 100.0 (fL)   MCH 28.9  26.0 - 34.0 (pg)   MCHC 33.2  30.0 - 36.0 (g/dL)   RDW 40.9  81.1 - 91.4 (%)   Platelets 263  150 - 400 (K/uL)  DIFFERENTIAL      Component Value Range   Neutrophils Relative 51  43 - 77 (%)   Neutro Abs 3.1  1.7 - 7.7 (K/uL)   Lymphocytes Relative 40  12 - 46 (%)   Lymphs Abs 2.4  0.7 - 4.0 (K/uL)   Monocytes Relative 9  3 - 12 (%)   Monocytes Absolute 0.5  0.1 - 1.0 (K/uL)   Eosinophils Relative 1  0 - 5 (%)   Eosinophils Absolute 0.1  0.0 - 0.7 (K/uL)   Basophils Relative 1  0 - 1 (%)   Basophils Absolute 0.0  0.0 - 0.1 (K/uL)  CK TOTAL AND CKMB      Component Value Range   Total CK 205 (*) 7 - 177  (U/L)   CK, MB 1.5  0.3 - 4.0 (ng/mL)   Relative Index 0.7  0.0 - 2.5   COMPREHENSIVE METABOLIC PANEL      Component Value Range   Sodium 138  135 - 145 (mEq/L)   Potassium 3.5  3.5 - 5.1 (mEq/L)   Chloride 104  96 - 112 (mEq/L)   CO2 27  19 - 32 (mEq/L)   Glucose, Bld 91  70 - 99 (mg/dL)   BUN 12  6 - 23 (mg/dL)   Creatinine, Ser 7.82  0.50 - 1.10 (mg/dL)   Calcium 9.1  8.4 - 95.6 (mg/dL)   Total Protein 7.1  6.0 - 8.3 (g/dL)   Albumin 3.5  3.5 - 5.2 (g/dL)   AST 16  0 - 37 (U/L)   ALT 11  0 - 35 (U/L)   Alkaline Phosphatase 72  39 - 117 (U/L)   Total Bilirubin 0.1 (*) 0.3 - 1.2 (mg/dL)   GFR calc non Af Amer 87 (*) >90 (mL/min)   GFR calc Af Amer >90  >90 (mL/min)  TROPONIN I      Component Value Range   Troponin I <0.30  <0.30 (ng/mL)  D-DIMER, QUANTITATIVE      Component Value Range   D-Dimer, Quant 0.41  0.00 - 0.48 (ug/mL-FEU)  PROTIME-INR      Component Value Range   Prothrombin Time 13.2  11.6 - 15.2 (seconds)   INR 0.98  0.00 - 1.49   APTT      Component Value Range   aPTT 28  24 - 37 (seconds)  PREGNANCY, URINE      Component Value Range   Preg Test, Ur NEGATIVE  NEGATIVE   LIPASE, BLOOD      Component Value Range   Lipase 41  11 - 59 (U/L)    Date: 07/18/2011  Rate: 73  Rhythm: normal sinus rhythm  QRS Axis: normal  Intervals: normal  ST/T Wave abnormalities: normal  Conduction Disutrbances: none  Narrative Interpretation: unremarkable      MDM  31 year old female without risk factors for coronary artery disease who has had intermittent chest pain for the past month.  She has a normal EKG with negative cardiac enzymes. D-dimer is normal. Chest x-Dyllin Gulley does not show any abnormalities. She has some chest wall tenderness and some right upper quadrant tenderness. She is advised to followup with her primary care physician.        Hilario Quarry, MD 07/18/11 [phone]

## 2012-02-03 ENCOUNTER — Emergency Department (HOSPITAL_COMMUNITY)
Admission: EM | Admit: 2012-02-03 | Discharge: 2012-02-03 | Disposition: A | Discharge status: Home / Self Care | Payer: 59 | Attending: Emergency Medicine | Admitting: Emergency Medicine

## 2012-02-03 ENCOUNTER — Encounter (HOSPITAL_COMMUNITY): Payer: Self-pay | Admitting: Emergency Medicine

## 2012-02-03 DIAGNOSIS — R079 Chest pain, unspecified: Secondary | ICD-10-CM

## 2012-02-03 DIAGNOSIS — K219 Gastro-esophageal reflux disease without esophagitis: Secondary | ICD-10-CM | POA: Insufficient documentation

## 2012-02-03 DIAGNOSIS — Z8619 Personal history of other infectious and parasitic diseases: Secondary | ICD-10-CM | POA: Insufficient documentation

## 2012-02-03 DIAGNOSIS — Z79899 Other long term (current) drug therapy: Secondary | ICD-10-CM | POA: Insufficient documentation

## 2012-02-03 DIAGNOSIS — J45909 Unspecified asthma, uncomplicated: Secondary | ICD-10-CM | POA: Insufficient documentation

## 2012-02-03 DIAGNOSIS — R0789 Other chest pain: Secondary | ICD-10-CM | POA: Insufficient documentation

## 2012-02-03 LAB — CBC
HCT: 38.3 % (ref 36.0–46.0)
Hemoglobin: 12.8 g/dL (ref 12.0–15.0)
MCH: 28.6 pg (ref 26.0–34.0)
MCHC: 33.4 g/dL (ref 30.0–36.0)
MCV: 85.7 fL (ref 78.0–100.0)
Platelets: 270 10*3/uL (ref 150–400)
RBC: 4.47 MIL/uL (ref 3.87–5.11)
RDW: 12.9 % (ref 11.5–15.5)
WBC: 5.8 10*3/uL (ref 4.0–10.5)

## 2012-02-03 LAB — BASIC METABOLIC PANEL
BUN: 10 mg/dL (ref 6–23)
CO2: 27 mEq/L (ref 19–32)
Calcium: 9.4 mg/dL (ref 8.4–10.5)
Chloride: 101 mEq/L (ref 96–112)
Creatinine, Ser: 0.73 mg/dL (ref 0.50–1.10)
GFR calc Af Amer: >90 mL/min (ref >90)
GFR calc non Af Amer: >90 mL/min (ref >90)
Glucose, Bld: 100 mg/dL — ABNORMAL HIGH (ref 70–99)
Potassium: 3.8 mEq/L (ref 3.5–5.1)
Sodium: 136 mEq/L (ref 135–145)

## 2012-02-03 LAB — TROPONIN I: Troponin I: <0.3 ng/mL (ref <0.30)

## 2012-02-03 LAB — POCT I-STAT TROPONIN I

## 2012-02-03 NOTE — ED Notes (Signed)
Pt presenting to ed with c/o chest pain onset 11:00am pt states no nausea or vomiting. Pt states tingling in her right arms. Pt states positive lightheadedness. Pt states "I feel so sleepy"

## 2012-02-03 NOTE — ED Provider Notes (Signed)
History     CSN: [id number]  Arrival date & time 02/03/12  1733   First MD Initiated Contact with Patient 02/03/12 2134      Chief Complaint  Patient presents with  . Chest Pain    (Consider location/radiation/quality/duration/timing/severity/associated sxs/prior treatment) HPI Comments: Patient is a 31 year old female with a history of asthma and GERD who presents to the ER complaining of chest pain.  Onset of symptoms began approximately 11 a.m., located primarily in the right chest and right upper back.  Pain is described as a tingling type sensation. She reports working out and doing push ups recently & that she is not used to working out. Pain is not exertional or pleuritic & pt denies any SOB, cough, hemoptysis, leg swelling, estrogen use, smoking or recent travel. She also denies fever, NS, chills, numbness or tingling of extremities, weakness, abdominal pain, N or emisis.   Patient is a 31 y.o. female presenting with chest pain. The history is provided by the patient.  Chest Pain Pertinent negatives for primary symptoms include no fever, no shortness of breath, no abdominal pain, no nausea, no vomiting and no dizziness.  Pertinent negatives for associated symptoms include no numbness and no weakness.     Past Medical History  Diagnosis Date  . Trichomonas   . HPV in female   . Asthma     Past Surgical History  Procedure Date  . Cervical cone biopsy   . Dilation and curettage of uterus     Family History  Problem Relation Age of Onset  . Hypertension Other   . Cancer Other     History  Substance Use Topics  . Smoking status: Never Smoker   . Smokeless tobacco: Not on file  . Alcohol Use: Yes     Comment: occasional    OB History    Grav Para Term Preterm Abortions TAB SAB Ect Mult Living                  Review of Systems  Constitutional: Negative for fever, chills and appetite change.  HENT: Negative for congestion.   Eyes: Negative for visual  disturbance.  Respiratory: Negative for shortness of breath.   Cardiovascular: Positive for chest pain. Negative for leg swelling.  Gastrointestinal: Negative for nausea, vomiting, abdominal pain, constipation, abdominal distention and anal bleeding.  Genitourinary: Negative for dysuria, urgency and frequency.  Neurological: Negative for dizziness, syncope, weakness, light-headedness, numbness and headaches.  Psychiatric/Behavioral: Negative for confusion.    Allergies  Codeine and Shellfish allergy  Home Medications   Current Outpatient Rx  Name  Route  Sig  Dispense  Refill  . BUTALBITAL-ACETAMINOPHEN 50-650 MG PO TABS   Oral   Take 1 tablet by mouth daily as needed. [place].         Marland Kitchen RISAQUAD PO CAPS   Oral   Take 2 capsules by mouth at bedtime.         . ALBUTEROL SULFATE HFA 108 (90 BASE) MCG/ACT IN AERS   Inhalation   Inhale 2 puffs into the lungs every 6 (six) hours as needed. Shortness of breath         . VITAMIN C PO   Oral   Take 1 tablet by mouth daily.          . ASPIRIN 325 MG PO TABS   Oral   Take 325 mg by mouth once.         Marland Kitchen DIHYDROXYALUMINUM SOD CARB 334 MG PO  CHEW   Oral   Chew 1 tablet by mouth 2 (two) times daily with a meal.         . ESOMEPRAZOLE MAGNESIUM 40 MG PO CPDR   Oral   Take 40 mg by mouth daily before breakfast.         . FLUTICASONE PROPIONATE 50 MCG/ACT NA SUSP   Nasal   Place 2 sprays into the nose daily.         . IBUPROFEN 800 MG PO TABS   Oral   Take 800 mg by mouth 2 (two) times daily as needed. Pain.         Marland Kitchen LORATADINE 10 MG PO TABS   Oral   Take 10 mg by mouth daily.         . MULTIVITAMIN PO   Oral   Take 2 tablets by mouth daily. Gummy tablets.         Marland Kitchen OVER THE COUNTER MEDICATION   Oral   Take 2 tablets by mouth daily.         Marland Kitchen VITAMIN E PO   Oral   Take 1 tablet by mouth daily.           BP 126/80  Pulse 89  Temp 98.3 F (36.8 C) (Oral)  Resp 16  SpO2  100%  Physical Exam  Nursing note and vitals reviewed. Constitutional: She appears well-developed and well-nourished. No distress.  HENT:  Head: Normocephalic and atraumatic.  Eyes: Conjunctivae normal and EOM are normal. Pupils are equal, round, and reactive to light.  Neck: Normal range of motion. Neck supple. Normal carotid pulses and no JVD present. Carotid bruit is not present. No rigidity. Normal range of motion present.  Cardiovascular: Normal rate, regular rhythm, S1 normal, S2 normal, normal heart sounds, intact distal pulses and normal pulses.  Exam reveals no gallop and no friction rub.   No murmur heard.      No pitting edema bilaterally, RRR, no aberrant sounds on auscultations, distal pulses intact, no carotid bruit or JVD.   Pulmonary/Chest: Effort normal and breath sounds normal. No accessory muscle usage or stridor. No respiratory distress. She exhibits no tenderness and no bony tenderness.  Abdominal: Bowel sounds are normal.       Soft non tender. Non pulsatile aorta.   Skin: Skin is warm, dry and intact. No rash noted. She is not diaphoretic. No cyanosis. Nails show no clubbing.    ED Course  Procedures (including critical care time)  Labs Reviewed  BASIC METABOLIC PANEL - Abnormal; Notable for the following:    Glucose, Bld 100 (*)     All other components within normal limits  CBC  POCT I-STAT TROPONIN I  TROPONIN I   No results found.  Date: 02/04/2012  Rate: 103  Rhythm: sinus tachy   QRS Axis: normal  Intervals: normal  ST/T Wave abnormalities: normal  Conduction Disutrbances: none  Narrative Interpretation:   Old EKG Reviewed: No significant changes noted     1. Chest pain    BP 126/80  Pulse 89  Temp 98.3 F (36.8 C) (Oral)  Resp 16  SpO2 100%    MDM  Chest pain, likely musculoskeletal  Patient is to be discharged with recommendation to follow up with PCP in regards to today's hospital visit. Chest pain is not likely of cardiac or  pulmonary etiology d/t presentation, VSS, no tracheal deviation, no JVD or new murmur, RRR, breath sounds equal bilaterally, EKG without acute abnormalities,  negative troponin, and negative CXR. Pt has been advised start a PPI and return to the ED is CP becomes exertional, associated with diaphoresis or nausea, radiates to left jaw/arm, worsens or becomes concerning in any way. Pt appears reliable for follow up and is agreeable to discharge. Discussed PE type symptoms including SOB or pleuritic pain as well.   Case has been discussed with and seen by Dr. Juleen China  who agrees with the above plan to discharge.             Jaci Carrel, [place] 02/04/12 [phone]

## 2012-02-03 NOTE — ED Notes (Signed)
Patient given discharge instructions, information, prescriptions, and diet order. Patient states that they adequately understand discharge information given and to return to ED if symptoms return or worsen.     

## 2012-02-07 NOTE — ED Provider Notes (Signed)
Medical screening examination/treatment/procedure(s) were performed by non-physician practitioner and as supervising physician I was immediately available for consultation/collaboration.  Raeford Razor, MD 02/07/12 [phone]

## 2012-03-09 ENCOUNTER — Ambulatory Visit: Payer: 59 | Attending: Internal Medicine | Admitting: Physical Therapy

## 2012-03-09 DIAGNOSIS — M545 Low back pain, unspecified: Secondary | ICD-10-CM | POA: Insufficient documentation

## 2012-03-09 DIAGNOSIS — IMO0001 Reserved for inherently not codable concepts without codable children: Secondary | ICD-10-CM | POA: Insufficient documentation

## 2012-03-09 DIAGNOSIS — M25559 Pain in unspecified hip: Secondary | ICD-10-CM | POA: Insufficient documentation

## 2012-03-09 DIAGNOSIS — M2569 Stiffness of other specified joint, not elsewhere classified: Secondary | ICD-10-CM | POA: Insufficient documentation

## 2012-03-14 ENCOUNTER — Ambulatory Visit: Payer: 59 | Admitting: Physical Therapy

## 2012-03-16 ENCOUNTER — Ambulatory Visit: Payer: 59 | Admitting: Physical Therapy

## 2012-03-26 ENCOUNTER — Ambulatory Visit: Payer: 59 | Admitting: Physical Therapy

## 2012-04-06 ENCOUNTER — Ambulatory Visit: Payer: 59 | Attending: Internal Medicine | Admitting: Physical Therapy

## 2012-04-06 DIAGNOSIS — M25559 Pain in unspecified hip: Secondary | ICD-10-CM | POA: Insufficient documentation

## 2012-04-06 DIAGNOSIS — M545 Low back pain, unspecified: Secondary | ICD-10-CM | POA: Insufficient documentation

## 2012-04-06 DIAGNOSIS — M2569 Stiffness of other specified joint, not elsewhere classified: Secondary | ICD-10-CM | POA: Insufficient documentation

## 2012-04-06 DIAGNOSIS — IMO0001 Reserved for inherently not codable concepts without codable children: Secondary | ICD-10-CM | POA: Insufficient documentation

## 2012-04-20 ENCOUNTER — Other Ambulatory Visit: Payer: Self-pay | Admitting: Obstetrics and Gynecology

## 2012-04-20 ENCOUNTER — Encounter: Payer: Self-pay | Admitting: Obstetrics and Gynecology

## 2012-04-20 ENCOUNTER — Ambulatory Visit: Payer: 59 | Admitting: Obstetrics and Gynecology

## 2012-04-20 VITALS — BP 116/70 | Ht 65.0 in | Wt 198.0 lb

## 2012-04-20 DIAGNOSIS — Z124 Encounter for screening for malignant neoplasm of cervix: Secondary | ICD-10-CM

## 2012-04-20 DIAGNOSIS — N915 Oligomenorrhea, unspecified: Secondary | ICD-10-CM

## 2012-04-20 DIAGNOSIS — Z139 Encounter for screening, unspecified: Secondary | ICD-10-CM

## 2012-04-20 DIAGNOSIS — N9989 Other postprocedural complications and disorders of genitourinary system: Secondary | ICD-10-CM

## 2012-04-20 DIAGNOSIS — G43909 Migraine, unspecified, not intractable, without status migrainosus: Secondary | ICD-10-CM | POA: Insufficient documentation

## 2012-04-20 DIAGNOSIS — R102 Pelvic and perineal pain: Secondary | ICD-10-CM | POA: Insufficient documentation

## 2012-04-20 DIAGNOSIS — N3090 Cystitis, unspecified without hematuria: Secondary | ICD-10-CM | POA: Insufficient documentation

## 2012-04-20 DIAGNOSIS — R1032 Left lower quadrant pain: Secondary | ICD-10-CM

## 2012-04-20 DIAGNOSIS — E282 Polycystic ovarian syndrome: Secondary | ICD-10-CM

## 2012-04-20 DIAGNOSIS — Z8619 Personal history of other infectious and parasitic diseases: Secondary | ICD-10-CM | POA: Insufficient documentation

## 2012-04-20 LAB — POCT URINE PREGNANCY: Preg Test, Ur: NEGATIVE

## 2012-04-20 LAB — POCT URINALYSIS DIPSTICK
Bilirubin, UA: NEGATIVE
Leukocytes, UA: NEGATIVE

## 2012-04-20 LAB — CBC
HCT: 36.7 % (ref 36.0–46.0)
Hemoglobin: 12 g/dL (ref 12.0–15.0)
WBC: 6.8 10*3/uL (ref 4.0–10.5)

## 2012-04-20 NOTE — Progress Notes (Signed)
Contraception Condoms/Withdrawal Last pap 03/27/2011 Normal Last Mammo 06/14/2011 Last Colonoscopy n/a Last Dexa Scan n/a Primary MD Glean Salvo Abuse at Home No   C/o left sided pain that's random  She last had depo in 2011 and had been on it for many yrs since teenage yrs.  Filed Vitals:   04/20/12 1356  BP: 116/70   ROS: noncontributory  Physical Examination: General appearance - alert, well appearing, and in no distress Neck - supple, no significant adenopathy Chest - clear to auscultation, no wheezes, rales or rhonchi, symmetric air entry Heart - normal rate and regular rhythm Abdomen - soft, nontender, nondistended, no masses or organomegaly Breasts - breasts appear normal, no suspicious masses, no skin or nipple changes or axillary nodes Pelvic - normal external genitalia, vulva, vagina, cervix, uterus and adnexa Back exam - no CVAT Extremities - no edema, redness or tenderness in the calves or thighs  A/P UA and UCx Pap today Pelvic u/s secondary to llq pelvic pain - next available Labs secondary to amenorrhea (last cycle 03/2011)  Will check labs - tsh, prl, testosterone, estradiol, cbc, vit d Will need a trial of provera but will get labs back first and u/s report - may do embx as well depending on u/s at [place]

## 2012-04-21 LAB — PROLACTIN: Prolactin: 15.6 ng/mL

## 2012-04-21 LAB — VITAMIN D 25 HYDROXY (VIT D DEFICIENCY, FRACTURES): Vit D, 25-Hydroxy: 29 ng/mL — ABNORMAL LOW (ref 30–89)

## 2012-04-23 ENCOUNTER — Telehealth: Payer: Self-pay

## 2012-04-23 ENCOUNTER — Other Ambulatory Visit: Payer: Self-pay

## 2012-04-23 DIAGNOSIS — E559 Vitamin D deficiency, unspecified: Secondary | ICD-10-CM

## 2012-04-23 LAB — TESTOSTERONE, FREE, TOTAL, SHBG
Sex Hormone Binding: 54 nmol/L (ref 18–114)
Testosterone, Free: 4.3 pg/mL (ref 0.6–6.8)
Testosterone: 32.99 ng/dL (ref 10–70)

## 2012-04-23 MED ORDER — VITAMIN D3 1.25 MG (50000 UT) PO CAPS: 1.0000 | ORAL_CAPSULE | ORAL | Status: AC

## 2012-04-23 NOTE — Telephone Encounter (Signed)
While speaking to this pt re: recent labs, pt stated she was under the impression that she had complete std testing this day. It was not ordered, but it can be added if pt wants. She does. Tests will be added to the sample taken 04/20/2012. Melody Comas A

## 2012-04-23 NOTE — Telephone Encounter (Signed)
LM for pt to cb re labs. Pt needs Vitamin D protocol. Melody Comas A

## 2012-04-23 NOTE — Telephone Encounter (Signed)
Notified pt of low Vit D level. Will e-rx Vit D protocol. Vit D future lab and recall entered. Melody Comas A

## 2012-04-24 LAB — PAP IG, CT-NG, RFX HPV ASCU: Chlamydia Probe Amp: NEGATIVE

## 2012-04-25 LAB — HSV(HERPES SIMPLEX VRS) I + II AB-IGG
HSV 1 Glycoprotein G Ab, IgG: 10.05 IV — ABNORMAL HIGH
HSV 2 Glycoprotein G Ab, IgG: 0.45 IV

## 2012-04-26 ENCOUNTER — Other Ambulatory Visit: Payer: Self-pay

## 2012-04-26 ENCOUNTER — Telehealth: Payer: Self-pay

## 2012-04-26 DIAGNOSIS — Z113 Encounter for screening for infections with a predominantly sexual mode of transmission: Secondary | ICD-10-CM

## 2012-04-26 NOTE — Telephone Encounter (Signed)
Spoke to pt to let her know that the blood tests that I tried to order for std testing were not able to be added due to not having enough blood.  Pt will come in tomorrow to be redrawn. [name] A

## 2012-04-27 ENCOUNTER — Other Ambulatory Visit: Payer: 59

## 2012-04-27 DIAGNOSIS — Z113 Encounter for screening for infections with a predominantly sexual mode of transmission: Secondary | ICD-10-CM

## 2012-04-27 DIAGNOSIS — E559 Vitamin D deficiency, unspecified: Secondary | ICD-10-CM

## 2012-04-28 LAB — HEPATITIS C ANTIBODY: HCV Ab: NEGATIVE

## 2012-04-28 LAB — HIV ANTIBODY (ROUTINE TESTING W REFLEX): HIV: NONREACTIVE

## 2012-05-09 ENCOUNTER — Encounter: Payer: 59 | Admitting: Obstetrics and Gynecology

## 2012-05-09 ENCOUNTER — Other Ambulatory Visit: Payer: 59

## 2012-05-15 ENCOUNTER — Other Ambulatory Visit: Payer: Self-pay

## 2012-05-15 ENCOUNTER — Telehealth: Payer: Self-pay

## 2012-05-15 DIAGNOSIS — Z113 Encounter for screening for infections with a predominantly sexual mode of transmission: Secondary | ICD-10-CM

## 2012-05-15 NOTE — Telephone Encounter (Signed)
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 [name] A

## 2012-05-17 ENCOUNTER — Other Ambulatory Visit: Payer: 59

## 2012-05-18 LAB — HSV 2 ANTIBODY, IGG: HSV 2 Glycoprotein G Ab, IgG: 0.41 IV

## 2012-11-19 ENCOUNTER — Encounter (HOSPITAL_COMMUNITY): Payer: Self-pay | Admitting: Emergency Medicine

## 2012-11-19 ENCOUNTER — Emergency Department (INDEPENDENT_AMBULATORY_CARE_PROVIDER_SITE_OTHER)
Admission: EM | Admit: 2012-11-19 | Discharge: 2012-11-19 | Disposition: A | Discharge status: Home / Self Care | Payer: 59 | Source: Home / Self Care

## 2012-11-19 DIAGNOSIS — M25559 Pain in unspecified hip: Secondary | ICD-10-CM

## 2012-11-19 DIAGNOSIS — G8929 Other chronic pain: Secondary | ICD-10-CM

## 2012-11-19 NOTE — ED Notes (Signed)
C/o left groin pain that radiates around to left upper hip and pain that shoots down through out left leg.  Pt states having problems off/on for years but is gradually getting worse. Pt has tried different back exercises and otc pain meds with no relief.  Denies injury

## 2012-11-19 NOTE — ED Provider Notes (Signed)
Medical screening examination/treatment/procedure(s) were performed by non-physician practitioner and as supervising physician I was immediately available for consultation/collaboration.  Raynald Blend, MD 11/19/12 [phone]

## 2012-11-19 NOTE — ED Provider Notes (Signed)
CSN: [id number]     Arrival date & time 11/19/12  1416 History     None    Chief Complaint  Patient presents with  . Groin Pain    left groin pain off/ on for years but is gradually getting worse   (Consider location/radiation/quality/duration/timing/severity/associated sxs/prior Treatment) HPI Comments: Pt with L groin/hip pain for 4 years.  Has been treated with ibuprofen and muscle relaxers several times for this but it never goes away. Wants a "scan" of the area to find out what's wrong. Pain is in anterior L hip/flexion area, and radiates to posterior hip; sometimes also radiates down lateral left leg.  Sometimes feels pins and needles in lateral left leg.   Patient is a 32 y.o. female presenting with groin pain. The history is provided by the patient.  Groin Pain This is a chronic problem. Episode onset: 4 years ago. The problem occurs constantly. The problem has not changed since onset.Pertinent negatives include no abdominal pain. The symptoms are aggravated by standing and walking. Nothing relieves the symptoms. Treatments tried: ibuprofen and muscle relaxers. The treatment provided mild relief.    Past Medical History  Diagnosis Date  . Trichomonas   . HPV in female   . Asthma   . Migraines   . Bladder infection   . History of chicken pox    Past Surgical History  Procedure Laterality Date  . Cervical cone biopsy    . Dilation and curettage of uterus    . Wisdom tooth extraction     Family History  Problem Relation Age of Onset  . Hypertension Other   . Cancer Other   . Asthma Brother    History  Substance Use Topics  . Smoking status: Never Smoker   . Smokeless tobacco: Never Used  . Alcohol Use: Yes     Comment: occasional   OB History   Grav Para Term Preterm Abortions TAB SAB Ect Mult Living   1    1  1         Review of Systems  Constitutional: Negative for fever and chills.  Gastrointestinal: Negative for abdominal pain.  Musculoskeletal:        Hip/groin pain  Skin: Negative for color change and rash.  Neurological: Negative for weakness and numbness.    Allergies  Codeine; Shellfish allergy; and Latex  Home Medications   Current Outpatient Rx  Name  Route  Sig  Dispense  Refill  . loratadine (CLARITIN) 10 MG tablet   Oral   Take 10 mg by mouth daily.         . ACETAMINOPHEN-BUTALBITAL (BUPAP) 50-650 MG TABS   Oral   Take 1 tablet by mouth daily as needed. Headeache.         Marland Kitchen acidophilus (RISAQUAD) CAPS   Oral   Take 2 capsules by mouth at bedtime.         Marland Kitchen albuterol (PROVENTIL HFA;VENTOLIN HFA) 108 (90 BASE) MCG/ACT inhaler   Inhalation   Inhale 2 puffs into the lungs every 6 (six) hours as needed. Shortness of breath         . Ascorbic Acid (VITAMIN C PO)   Oral   Take 1 tablet by mouth daily.          Marland Kitchen aspirin 325 MG tablet   Oral   Take 325 mg by mouth once.         . calcium carbonate-magnesium hydroxide (ROLAIDS) 334 MG CHEW   Oral  Chew 1 tablet by mouth 2 (two) times daily with a meal.         . Cholecalciferol (VITAMIN D3) 50000 UNITS CAPS   Oral   Take 1 capsule by mouth once a week.   20 capsule   0   . esomeprazole (NEXIUM) 40 MG capsule   Oral   Take 40 mg by mouth daily before breakfast.         . fluticasone (FLONASE) 50 MCG/ACT nasal spray   Nasal   Place 2 sprays into the nose daily.         Marland Kitchen ibuprofen (ADVIL,MOTRIN) 800 MG tablet   Oral   Take 800 mg by mouth 2 (two) times daily as needed. Pain.         . Multiple Vitamins-Minerals (MULTIVITAMIN PO)   Oral   Take 2 tablets by mouth daily. Gummy tablets.         Marland Kitchen OVER THE COUNTER MEDICATION   Oral   Take 2 tablets by mouth daily.         Marland Kitchen VITAMIN E PO   Oral   Take 1 tablet by mouth daily.          BP 142/91  Pulse 100  Temp(Src) 98 F (36.7 C) (Oral)  Resp 20  SpO2 99%  LMP 11/12/2012 Physical Exam  Constitutional: She appears well-developed and well-nourished. No distress.   obese  Musculoskeletal:       Left hip: She exhibits tenderness. She exhibits normal range of motion, normal strength, no bony tenderness, no swelling, no crepitus and no deformity.  Neurological: Gait normal.    ED Course   Procedures (including critical care time)  Labs Reviewed - No data to display No results found. 1. Hip pain, chronic, left     MDM  Pt reports the best her pain has ever been in the last 4 years is when she was in physical therapy for it while taking ibuprofen.  Encouraged pt to f/u with her pcp about this pain; she may benefit from further physical therapy.  Pt angry she cannot get "scan" of L "groin" and back "because I'm the patient and I want it". Tried to explain [facility] doesn't have such a scanner, and that she would have to discuss with her pcp. Pt left angry prior to receiving discharge instructions.   Cathlyn Parsons, NP 11/19/12 1504

## 2012-11-23 ENCOUNTER — Other Ambulatory Visit: Payer: Self-pay | Admitting: Internal Medicine

## 2012-11-23 DIAGNOSIS — R109 Unspecified abdominal pain: Secondary | ICD-10-CM

## 2012-11-23 DIAGNOSIS — M545 Low back pain, unspecified: Secondary | ICD-10-CM

## 2012-12-04 ENCOUNTER — Ambulatory Visit
Admission: RE | Admit: 2012-12-04 | Discharge: 2012-12-04 | Disposition: A | Discharge status: Home / Self Care | Payer: 59 | Source: Ambulatory Visit | Attending: Internal Medicine | Admitting: Internal Medicine

## 2012-12-04 ENCOUNTER — Other Ambulatory Visit: Payer: 59

## 2012-12-04 DIAGNOSIS — M545 Low back pain: Secondary | ICD-10-CM

## 2012-12-04 DIAGNOSIS — R109 Unspecified abdominal pain: Secondary | ICD-10-CM

## 2013-07-12 ENCOUNTER — Other Ambulatory Visit: Payer: Self-pay | Admitting: Obstetrics and Gynecology

## 2013-07-12 DIAGNOSIS — N6310 Unspecified lump in the right breast, unspecified quadrant: Secondary | ICD-10-CM

## 2013-07-19 ENCOUNTER — Ambulatory Visit
Admission: RE | Admit: 2013-07-19 | Discharge: 2013-07-19 | Disposition: A | Discharge status: Home / Self Care | Payer: 59 | Source: Ambulatory Visit | Attending: Obstetrics and Gynecology | Admitting: Obstetrics and Gynecology

## 2013-07-19 ENCOUNTER — Encounter (INDEPENDENT_AMBULATORY_CARE_PROVIDER_SITE_OTHER): Payer: Self-pay

## 2013-07-19 DIAGNOSIS — N6310 Unspecified lump in the right breast, unspecified quadrant: Secondary | ICD-10-CM

## 2014-01-27 ENCOUNTER — Encounter (HOSPITAL_COMMUNITY): Payer: Self-pay | Admitting: Emergency Medicine

## 2014-12-29 ENCOUNTER — Other Ambulatory Visit: Payer: Self-pay | Admitting: Neurology

## 2014-12-29 DIAGNOSIS — R062 Wheezing: Secondary | ICD-10-CM

## 2014-12-29 MED ORDER — ALBUTEROL SULFATE HFA 108 (90 BASE) MCG/ACT IN AERS: 2.0000 | INHALATION_SPRAY | Freq: Four times a day (QID) | RESPIRATORY_TRACT | Status: AC | PRN

## 2015-04-21 ENCOUNTER — Other Ambulatory Visit: Payer: Self-pay | Admitting: Allergy and Immunology

## 2015-06-26 ENCOUNTER — Ambulatory Visit (INDEPENDENT_AMBULATORY_CARE_PROVIDER_SITE_OTHER): Payer: 59 | Admitting: Neurology

## 2015-06-26 ENCOUNTER — Encounter: Payer: Self-pay | Admitting: Neurology

## 2015-06-26 VITALS — BP 116/84 | HR 113 | Ht 65.0 in | Wt 181.0 lb

## 2015-06-26 DIAGNOSIS — G43009 Migraine without aura, not intractable, without status migrainosus: Secondary | ICD-10-CM

## 2015-06-26 MED ORDER — TOPIRAMATE 25 MG PO TABS: 25.0000 mg | ORAL_TABLET | Freq: Every day | ORAL | Status: DC

## 2015-06-26 MED ORDER — SUMATRIPTAN SUCCINATE 100 MG PO TABS: 100.0000 mg | ORAL_TABLET | Freq: Once | ORAL | Status: DC | PRN

## 2015-06-26 NOTE — Patient Instructions (Signed)
Migraine Recommendations: 1.  Start topiramate 25mg  at bedtime.    Possible side effects include: impaired thinking, sedation, paresthesias (numbness and tingling) and weight loss.  It may cause dehydration and there is a small risk for kidney stones, so make sure to stay hydrated with water during the day.  There is also a very small risk for glaucoma, so if you notice any change in your vision while taking this medication, see an ophthalmologist.   Call in 4 weeks with update and we can adjust dose if needed. 2.  Take sumatriptan 1 tablet at earliest onset of headache.  May repeat dose once in 2 hours if needed.  Do not exceed two tablets in 24 hours. 3.  STOP BUPAP.  Limit use of pain relievers to no more than 2 days out of the week.  These medications include acetaminophen, ibuprofen, triptans and narcotics.  This will help reduce risk of rebound headaches. 4.  Be aware of common food triggers such as processed sweets, processed foods with nitrites (such as deli meat, hot dogs, sausages), foods with MSG, alcohol (such as wine), chocolate, certain cheeses, certain fruits (dried fruits, some citrus fruit), vinegar, diet soda. 4.  Avoid caffeine 5.  Routine exercise 6.  Proper sleep hygiene 7.  Stay adequately hydrated with water 8.  Keep a headache diary. 9.  Maintain proper stress management. 10.  Do not skip meals. 11.  Consider supplements:  Magnesium citrate 400mg  to 600mg  daily, riboflavin 400mg , Coenzyme Q 10 100mg  three times daily 12.  Follow up in 3 to 4 months.

## 2015-06-26 NOTE — Progress Notes (Signed)
NEUROLOGY CONSULTATION NOTE  [name] MRN: [id number] DOB: 24-Aug-1980  Referring provider: Dr. Nehemiah Settle Primary care provider: Dr. Nehemiah Settle  Reason for consult:  headache  HISTORY OF PRESENT ILLNESS: [name] is a 35 year old right-handed female asthma and back pain who presents for headache.  History obtained by patient and PCP note.  Imaging of head CT from 2010 reviewed.  Onset:  Approximately 2010 Location:  Back of head and neck and frontal  Quality:  throbbing Intensity:  10/10 Aura:  no Prodrome:  no Associated symptoms:  Photophobia, phonophobia, osmophobia, blurred vision.  No nausea Duration:  1 to 2 hours with medication (cannot remember how long without medication) Frequency:  6-8 days per month, worse during the Spring Triggers/exacerbating factors:  Stress, change in weather, menstrual cycle Relieving factors:  Laying down in dark, quiet room Activity:  Needs to lay down  A CT of head performed on 09/30/08 to evaluate headache was normal.  Past NSAIDS:  Ibuprofen, Aleve Past analgesics:  Tylenol, BC, Goody Past abortive triptans:  none Past antihypertensive medications:  none Past antidepressant medications:  none Past anticonvulsant medications:  none Past vitamins/Herbal/Supplements: none  Current NSAIDS:  meloxicam 7.5mg  (for back pain as well as headache) Current analgesics:  Bupap  Current triptans:  none Current anti-emetic:  none Current muscle relaxants:  cyclobenzaprine  Current anti-anxiolytic:  none Current sleep aide:  none Current Antihypertensive medications:  none Current Antidepressant medications:  none Current Anticonvulsant medications:  none Current Vitamins/Herbal/Supplements:  MVI, C, caltrate-D Current Antihistamines/Decongestants:  Flonase, Zyrtec Other therapy:  none  Caffeine:  decaff tea Alcohol:  rarely Smoker:  no Diet:  Increasing water intake Exercise:  Not routine Depression/stress:  Life-related  stress Sleep hygiene:  poor Family history of headache:  no  PAST MEDICAL HISTORY: Past Medical History  Diagnosis Date  . Trichomonas   . HPV in female   . Asthma   . Migraines   . Bladder infection   . History of chicken pox     PAST SURGICAL HISTORY: Past Surgical History  Procedure Laterality Date  . Cervical cone biopsy    . Dilation and curettage of uterus    . Wisdom tooth extraction      MEDICATIONS: Current Outpatient Prescriptions on File Prior to Visit  Medication Sig Dispense Refill  . ACETAMINOPHEN-BUTALBITAL (BUPAP) 50-650 MG TABS Take 1 tablet by mouth daily as needed. Headeache.    . albuterol (PROVENTIL HFA;VENTOLIN HFA) 108 (90 BASE) MCG/ACT inhaler Inhale 2 puffs into the lungs every 6 (six) hours as needed for wheezing or shortness of breath. 1 Inhaler 2  . Ascorbic Acid (VITAMIN C PO) Take 1 tablet by mouth daily.     . Cholecalciferol (VITAMIN D3) 50000 UNITS CAPS Take 1 capsule by mouth once a week. 20 capsule 0  . EPIPEN 2-PAK 0.3 MG/0.3ML SOAJ injection USE AS DIRECTED IN THE EVENT OF A SEVERE LIFE THREATNING ALLERGIC REACTION. 2 Device 0  . esomeprazole (NEXIUM) 40 MG capsule Take 40 mg by mouth daily before breakfast. Reported on 06/26/2015    . fluticasone (FLONASE) 50 MCG/ACT nasal spray Place 2 sprays into the nose daily. Reported on 06/26/2015    . loratadine (CLARITIN) 10 MG tablet Take 10 mg by mouth daily. Reported on 06/26/2015     No current facility-administered medications on file prior to visit.    ALLERGIES: Allergies  Allergen Reactions  . Codeine Anaphylaxis  . Shellfish Allergy Anaphylaxis  . Latex Hives  FAMILY HISTORY: Family History  Problem Relation Age of Onset  . Hypertension Other   . Cancer Other   . Asthma Brother     SOCIAL HISTORY: Social History   Social History  . Marital Status: Single    Spouse Name: N/A  . Number of Children: N/A  . Years of Education: N/A   Occupational History  . Not on file.     Social History Main Topics  . Smoking status: Never Smoker   . Smokeless tobacco: Never Used  . Alcohol Use: Yes     Comment: occasional  . Drug Use: No  . Sexual Activity: Yes    Birth Control/ Protection: Condom   Other Topics Concern  . Not on file   Social History Narrative    REVIEW OF SYSTEMS: Constitutional: No fevers, chills, or sweats, no generalized fatigue, change in appetite Eyes: No visual changes, double vision, eye pain Ear, nose and throat: No hearing loss, ear pain, nasal congestion, sore throat Cardiovascular: No chest pain, palpitations Respiratory:  No shortness of breath at rest or with exertion, wheezes GastrointestinaI: No nausea, vomiting, diarrhea, abdominal pain, fecal incontinence Genitourinary:  No dysuria, urinary retention or frequency Musculoskeletal:  No neck pain, back pain Integumentary: No rash, pruritus, skin lesions Neurological: as above Psychiatric: No depression, insomnia, anxiety Endocrine: No palpitations, fatigue, diaphoresis, mood swings, change in appetite, change in weight, increased thirst Hematologic/Lymphatic:  No anemia, purpura, petechiae. Allergic/Immunologic: no itchy/runny eyes, nasal congestion, recent allergic reactions, rashes  PHYSICAL EXAM: Filed Vitals:   06/26/15 0832  BP: 116/84  Pulse: 113   General: No acute distress.  Patient appears well-groomed.  Head:  Normocephalic/atraumatic Eyes:  fundi unremarkable, without vessel changes, exudates, hemorrhages or papilledema. Neck: supple, no paraspinal tenderness, full range of motion Back: No paraspinal tenderness Heart: regular rate and rhythm Lungs: Clear to auscultation bilaterally. Vascular: No carotid bruits. Neurological Exam: Mental status: alert and oriented to person, place, and time, recent and remote memory intact, fund of knowledge intact, attention and concentration intact, speech fluent and not dysarthric, language intact. Cranial nerves: CN I:  not tested CN II: pupils equal, round and reactive to light, visual fields intact, fundi unremarkable, without vessel changes, exudates, hemorrhages or papilledema. CN III, IV, VI:  full range of motion, no nystagmus, no ptosis CN V: facial sensation intact CN VII: upper and lower face symmetric CN VIII: hearing intact CN IX, X: gag intact, uvula midline CN XI: sternocleidomastoid and trapezius muscles intact CN XII: tongue midline Bulk & Tone: normal, no fasciculations. Motor:  5/5 throughout  Sensation: temperature and vibration sensation intact. Deep Tendon Reflexes:  2+ throughout, toes downgoing.  Finger to nose testing:  Without dysmetria.  Heel to shin:  Without dysmetria.  Gait:  Normal station and stride.  Able to turn and tandem walk. Romberg negative.  IMPRESSION: Migraine without aura  PLAN: 1.  Start topiramate 25mg  at bedtime 2.  Sumatriptan 100mg  for abortive therapy.  3.  Stop Bupap.  May take NSAID and Flexeril as needed but limit to no more than 2 days out of the week 4.  Lifestyle modification discussed 5.  Follow up in 3-4 months.  She is to contact us in 4 weeks with update.  Thank you for allowing me to take part in the care of this patient.  Shon MilletAdam Jaffe, DO  CC:  Renford Dillsonald Polite, MD

## 2015-06-26 NOTE — Progress Notes (Signed)
Chart forwarded.  

## 2015-06-26 NOTE — Addendum Note (Signed)
Addended by: Franciso BendMCNEIL, Rayshawn Visconti M on: 06/26/2015 03:47 PM   Modules accepted: Orders

## 2015-07-02 ENCOUNTER — Other Ambulatory Visit: Payer: Self-pay | Admitting: Orthopaedic Surgery

## 2015-07-02 DIAGNOSIS — M545 Low back pain: Secondary | ICD-10-CM

## 2015-07-05 ENCOUNTER — Ambulatory Visit
Admission: RE | Admit: 2015-07-05 | Discharge: 2015-07-05 | Disposition: A | Discharge status: Home / Self Care | Payer: 59 | Source: Ambulatory Visit | Attending: Orthopaedic Surgery | Admitting: Orthopaedic Surgery

## 2015-07-05 DIAGNOSIS — M545 Low back pain: Secondary | ICD-10-CM

## 2015-08-25 ENCOUNTER — Telehealth: Payer: Self-pay | Admitting: Neurology

## 2015-08-25 MED ORDER — ELETRIPTAN HYDROBROMIDE 20 MG PO TABS: ORAL_TABLET | ORAL | Status: DC

## 2015-08-25 MED ORDER — TOPIRAMATE 25 MG PO TABS: ORAL_TABLET | ORAL | Status: DC

## 2015-08-25 NOTE — Telephone Encounter (Signed)
Lmovm to rtn my call. 

## 2015-08-25 NOTE — Telephone Encounter (Signed)
Patient returned my call. She is agreeable to advisements. She will finish out the Rx's she has at home. She will double up on the current Rx of Topamax that she has until she finishes that bottle and will complete the bottle of Imitrex that she has as she doesn't want to waste any meds that she has already paid for. Will send in new Rx for Topamax to reflect sig change and will send in new Rx for Relpax.

## 2015-08-25 NOTE — Telephone Encounter (Signed)
Patient states that rescue med/Imitrex doesn't work well for her, states she had to use it last week for a few days when headaches increased and didn't get much relief even after taking a 2nd dose. Wants to know if there is something different she can try. States she has been doing well on daily preventative of Topamax 25 mg at bedtime.

## 2015-08-25 NOTE — Telephone Encounter (Signed)
Two options:  (1) increase topiramate to 50mg  at bedtime or (2) she can try Relpax 20mg  as a rescue medication - take 1 tablet at headache onset, okay to repeat in 2 hours.  Stop imitrex.  Goal of increasing topiramate to is reduce headache frequency and intensity so she does not need to use a rescue medication.

## 2015-08-25 NOTE — Telephone Encounter (Signed)
Pt states that imitrex is not working and would like to talk to someone please call [phone]

## 2015-08-26 ENCOUNTER — Other Ambulatory Visit: Payer: Self-pay

## 2015-08-26 MED ORDER — TOPIRAMATE 25 MG PO TABS: ORAL_TABLET | ORAL | Status: DC

## 2015-08-28 DIAGNOSIS — Z029 Encounter for administrative examinations, unspecified: Secondary | ICD-10-CM

## 2015-09-02 ENCOUNTER — Telehealth: Payer: Self-pay

## 2015-09-02 NOTE — Telephone Encounter (Signed)
FMLA forms completed. Placed at front desk for pickup. Copy made and placed in scan box. Will contact pt after 8 a.m. To notify.

## 2015-09-02 NOTE — Telephone Encounter (Signed)
Left message on pt.'s vm.

## 2015-09-18 ENCOUNTER — Other Ambulatory Visit: Payer: Self-pay

## 2015-09-22 ENCOUNTER — Other Ambulatory Visit: Payer: Self-pay

## 2015-10-09 ENCOUNTER — Encounter: Payer: Self-pay | Admitting: Neurology

## 2015-10-09 ENCOUNTER — Ambulatory Visit (INDEPENDENT_AMBULATORY_CARE_PROVIDER_SITE_OTHER): Payer: 59 | Admitting: Neurology

## 2015-10-09 ENCOUNTER — Other Ambulatory Visit: Payer: 59

## 2015-10-09 VITALS — BP 130/78 | HR 79 | Ht 64.0 in | Wt 181.1 lb

## 2015-10-09 DIAGNOSIS — G43709 Chronic migraine without aura, not intractable, without status migrainosus: Secondary | ICD-10-CM

## 2015-10-09 LAB — BASIC METABOLIC PANEL
BUN: 11 mg/dL (ref 7–25)
CALCIUM: 8.9 mg/dL (ref 8.6–10.2)
CHLORIDE: 108 mmol/L (ref 98–110)
CO2: 21 mmol/L (ref 20–31)
CREATININE: 0.7 mg/dL (ref 0.50–1.10)
Glucose, Bld: 74 mg/dL (ref 65–99)
Potassium: 4.4 mmol/L (ref 3.5–5.3)
Sodium: 140 mmol/L (ref 135–146)

## 2015-10-09 MED ORDER — TOPIRAMATE 100 MG PO TABS: 100.0000 mg | ORAL_TABLET | Freq: Every day | ORAL | Status: DC

## 2015-10-09 MED ORDER — ELETRIPTAN HYDROBROMIDE 20 MG PO TABS: ORAL_TABLET | ORAL | Status: AC

## 2015-10-09 NOTE — Patient Instructions (Signed)
1. We will increase the topiramate.  Take 3 tablets at bedtime for 1 week, then increase to 4 tablets at bedtime.  When you run out, start the 100mg  tablet, so you only need to take 1 tablet at bedtime. 2.  If headaches don't get better in 30 minutes, take tylenol.  If they are not better in one hour after tylenol, then take the Relpax.  May repeat dose once in 2 hours if needed. 3.  Follow up in 3 months but contact us in 6 weeks with update.

## 2015-10-09 NOTE — Progress Notes (Signed)
NEUROLOGY FOLLOW UP OFFICE NOTE  Misty NordmannCatrina Fredrick [id number]  HISTORY OF PRESENT ILLNESS: [name] is a 35 year old right-handed female asthma and back pain who follows up for migraine.  UPDATE: Overall, she is doing better. She has mild headaches (5-6/10) lasting 30 minutes about 3 to 4 days per week. She has more severe headaches (9-10/10) about 2 to 3 days per month. Typical regimen for abortive therapy:  Mild to moderate headaches usually resolve after 30 minutes.  If they do not resolve after 30 minutes, she will take Tylenol.  If they persist and they become severe, then she will take Relpax 20mg , of which they will last another 30 minutes.  Current NSAIDS:  meloxicam 7.5mg  (for back pain as well as headache) Current analgesics: tylenol Current triptans:  Relpax 20mg  Current anti-emetic:  none Current muscle relaxants:  cyclobenzaprine 10mg  Current anti-anxiolytic:  none Current sleep aide:  none Current Antihypertensive medications:  none Current Antidepressant medications:  none Current Anticonvulsant medications:  topiramate 50mg  Current Vitamins/Herbal/Supplements:  MVI, C, caltrate-D Current Antihistamines/Decongestants:  Flonase, Zyrtec Other therapy:  none  HISTORY: Onset:  Approximately 2010 Location:  Back of head and neck and frontal  Quality:  throbbing Intensity:  10/10 Aura:  no Prodrome:  no Associated symptoms:  Photophobia, phonophobia, osmophobia, blurred vision.  No nausea Duration:  1 to 2 hours with medication (cannot remember how long without medication) Frequency:  6-8 days per month, worse during the Spring Triggers/exacerbating factors:  Stress, change in weather, menstrual cycle Relieving factors:  Laying down in dark, quiet room Activity:  Needs to lay down  A CT of head performed on 09/30/08 to evaluate headache was normal.  Past NSAIDS:  Ibuprofen, Aleve Past analgesics:  Tylenol, BC, Goody, Bupap Past abortive triptans:   sumatriptan 100mg  Past antihypertensive medications:  none Past antidepressant medications:  none Past anticonvulsant medications:  none Past vitamins/Herbal/Supplements: none  PAST MEDICAL HISTORY: Past Medical History  Diagnosis Date  . Trichomonas   . HPV in female   . Asthma   . Migraines   . Bladder infection   . History of chicken pox     MEDICATIONS: Current Outpatient Prescriptions on File Prior to Visit  Medication Sig Dispense Refill  . ACETAMINOPHEN-BUTALBITAL (BUPAP) 50-650 MG TABS Take 1 tablet by mouth daily as needed. Headeache.    . albuterol (PROVENTIL HFA;VENTOLIN HFA) 108 (90 BASE) MCG/ACT inhaler Inhale 2 puffs into the lungs every 6 (six) hours as needed for wheezing or shortness of breath. 1 Inhaler 2  . Ascorbic Acid (VITAMIN C PO) Take 1 tablet by mouth daily.     . Budesonide (RHINOCORT ALLERGY NA) Place into the nose.    . cetirizine (ZYRTEC) 10 MG tablet Take 10 mg by mouth daily.    . Cetirizine HCl (ZYRTEC ALLERGY) 10 MG CAPS Take by mouth.    . Cholecalciferol (VITAMIN D3) 50000 UNITS CAPS Take 1 capsule by mouth once a week. 20 capsule 0  . cyclobenzaprine (FLEXERIL) 5 MG tablet Take 5 mg by mouth 3 (three) times daily as needed for muscle spasms.    Marland Kitchen. EPIPEN 2-PAK 0.3 MG/0.3ML SOAJ injection USE AS DIRECTED IN THE EVENT OF A SEVERE LIFE THREATNING ALLERGIC REACTION. 2 Device 0  . esomeprazole (NEXIUM) 40 MG capsule Take 40 mg by mouth daily before breakfast. Reported on 06/26/2015    . fluticasone (FLONASE) 50 MCG/ACT nasal spray Place 2 sprays into the nose daily. Reported on 06/26/2015    . ketotifen (  ZADITOR) 0.025 % ophthalmic solution 1 drop 2 (two) times daily.    Marland Kitchen loratadine (CLARITIN) 10 MG tablet Take 10 mg by mouth daily. Reported on 06/26/2015    . meloxicam (MOBIC) 7.5 MG tablet Take 7.5 mg by mouth daily.    . ranitidine (ZANTAC) 300 MG capsule Take 300 mg by mouth every evening.     No current facility-administered medications on file  prior to visit.    ALLERGIES: Allergies  Allergen Reactions  . Codeine Anaphylaxis  . Shellfish Allergy Anaphylaxis  . Latex Hives    FAMILY HISTORY: Family History  Problem Relation Age of Onset  . Hypertension Other   . Cancer Other   . Asthma Brother     SOCIAL HISTORY: Social History   Social History  . Marital Status: Single    Spouse Name: N/A  . Number of Children: N/A  . Years of Education: N/A   Occupational History  . Not on file.   Social History Main Topics  . Smoking status: Never Smoker   . Smokeless tobacco: Never Used  . Alcohol Use: Yes     Comment: occasional  . Drug Use: No  . Sexual Activity: Yes    Birth Control/ Protection: Condom   Other Topics Concern  . Not on file   Social History Narrative    REVIEW OF SYSTEMS: Constitutional: No fevers, chills, or sweats, no generalized fatigue, change in appetite Eyes: No visual changes, double vision, eye pain Ear, nose and throat: No hearing loss, ear pain, nasal congestion, sore throat Cardiovascular: No chest pain, palpitations Respiratory:  No shortness of breath at rest or with exertion, wheezes GastrointestinaI: No nausea, vomiting, diarrhea, abdominal pain, fecal incontinence Genitourinary:  No dysuria, urinary retention or frequency Musculoskeletal:  No neck pain, back pain Integumentary: No rash, pruritus, skin lesions Neurological: as above Psychiatric: No depression, insomnia, anxiety Endocrine: No palpitations, fatigue, diaphoresis, mood swings, change in appetite, change in weight, increased thirst Hematologic/Lymphatic:  No purpura, petechiae. Allergic/Immunologic: no itchy/runny eyes, nasal congestion, recent allergic reactions, rashes  PHYSICAL EXAM: Filed Vitals:   10/09/15 0745  BP: 130/78  Pulse: 79   General: No acute distress.  Patient appears well-groomed.   Head:  Normocephalic/atraumatic Eyes:  Fundi examined but not visualized Neurological Exam: alert and  oriented to person, place, and time. Attention span and concentration intact, recent and remote memory intact, fund of knowledge intact.  Speech fluent and not dysarthric, language intact.  CN II-XII intact. Bulk and tone normal, muscle strength 5/5 throughout.  Sensation to light touch  intact.  Deep tendon reflexes 2+ throughout, toes downgoing.  Finger to nose and heel to shin testing intact.  Gait normal.  IMPRESSION: Chronic migraine  PLAN: 1.  Will titrate topiramate to  at bedtime for one week, then  at bedtime in order to reduce frequency of headache. 2.  Abortive regimen:  First line Tylenol; Second line Relapx 3.  Check BMP to assess kidney function (as she is taking topiramate) 4.  Follow up in 3 months.  15 minutes spent face to face with patient, over 50% spent discussing management.  Shon Millet, DO  CC:  Renford Dills, MD

## 2015-10-09 NOTE — Progress Notes (Signed)
Chart forwarded.  

## 2015-10-19 ENCOUNTER — Telehealth: Payer: Self-pay

## 2015-10-19 NOTE — Telephone Encounter (Signed)
Form updated and faxed to [phone]

## 2015-10-19 NOTE — Telephone Encounter (Signed)
Patient called to see if you will update her recent FMLA forms. Pt states on them you wrote her out for 3 days, pt states she has been using that time by the hour, and is now in trouble at work. Would like form updated to stated pt can miss up to 24 hours a month of work vs 3 days. Please advise.   [phone]

## 2015-10-19 NOTE — Telephone Encounter (Signed)
We can make that change.

## 2015-10-19 NOTE — Telephone Encounter (Signed)
Left message on machine for pt to return call to the office.  

## 2015-10-22 ENCOUNTER — Encounter: Payer: Self-pay | Admitting: Allergy and Immunology

## 2015-10-22 ENCOUNTER — Encounter (INDEPENDENT_AMBULATORY_CARE_PROVIDER_SITE_OTHER): Payer: Self-pay

## 2015-10-22 ENCOUNTER — Ambulatory Visit (INDEPENDENT_AMBULATORY_CARE_PROVIDER_SITE_OTHER): Payer: 59 | Admitting: Allergy and Immunology

## 2015-10-22 VITALS — BP 118/78 | HR 88 | Resp 16

## 2015-10-22 DIAGNOSIS — J309 Allergic rhinitis, unspecified: Secondary | ICD-10-CM | POA: Diagnosis not present

## 2015-10-22 DIAGNOSIS — R05 Cough: Secondary | ICD-10-CM | POA: Diagnosis not present

## 2015-10-22 DIAGNOSIS — H1010 Acute atopic conjunctivitis, unspecified eye: Secondary | ICD-10-CM | POA: Diagnosis not present

## 2015-10-22 DIAGNOSIS — R062 Wheezing: Secondary | ICD-10-CM | POA: Diagnosis not present

## 2015-10-22 DIAGNOSIS — R059 Cough, unspecified: Secondary | ICD-10-CM

## 2015-10-22 MED ORDER — EPINEPHRINE 0.3 MG/0.3ML IJ SOAJ: 0.3000 mg | Freq: Once | INTRAMUSCULAR | 1 refills | Status: AC

## 2015-10-22 MED ORDER — MONTELUKAST SODIUM 10 MG PO TABS: 10.0000 mg | ORAL_TABLET | Freq: Every day | ORAL | 5 refills | Status: DC

## 2015-10-22 NOTE — Progress Notes (Signed)
     FOLLOW UP NOTE  RE: [name] MRN: [id number] DOB: 08/17/1980 [facility] [address] [place] [address] Date of Office Visit: 10/22/2015  Subjective:  [name] is a 35 y.o. female who presents today for Medication Management  Assessment:   1. History of cough and wheeze.   2. Shellfish allergy--avoidance and emergency action plan in place.  3. Allergic rhinoconjunctivitis.    Plan:   Meds ordered this encounter  Medications  . EPINEPHrine (EPIPEN 2-PAK) 0.3 mg/0.3 mL IJ SOAJ injection    Sig: Inject 0.3 mLs (0.3 mg total) into the muscle once.    Dispense:  2 Device    Refill:  1    Mylan generic.  . montelukast (SINGULAIR) 10 MG tablet    Sig: Take 1 tablet (10 mg total) by mouth at bedtime.    Dispense:  30 tablet    Refill:  5  1.  Continue current medication regime. 2.  Continue food avoidance and Emergency action plan in place.  3.  Saline nasal wash each evening at shower time. 4.  Follow-up in 6 months or sooner if needed.  HPI: [name] returns to the office in follow-up of allergic rhinoconjunctivitis and cough and wheeze.  Since her last visit in April 2016, she reports feeling well.  She is requesting refills and avoiding shellfish without difficulty.  She denies any hive episodes, skin difficulties and in the last week has restarted Singulair and Claritin.  She finds them beneficial and prefers to maintain that regime.  She denies any recent cough, congestion, shortness of breath or difficulty in breathing and only mild nasal symptoms prior to restart of daily medications.  No other new medical issues, questions or concerns.  Denies ED or urgent care visits, prednisone or antibiotic courses. Reports sleep and activity are normal and no nocturnal awakenings.  [name] has a current medication list which includes the following prescription(s): albuterol, vitamin d3, cyclobenzaprine, eletriptan, esomeprazole,  loratadine, ranitidine, sumatriptan, topiramate, acetaminophen-butalbital, ascorbic acid, and montelukast.   Drug Allergies: Allergies  Allergen Reactions  . Codeine Anaphylaxis  . Shellfish Allergy Anaphylaxis  . Latex Hives   Objective:   Vitals:   10/22/15 1724  BP: 118/78  Pulse: 88  Resp: 16   Physical Exam  Constitutional: She is well-developed, well-nourished, and in no distress.  HENT:  Head: Atraumatic.  Right Ear: Tympanic membrane and ear canal normal.  Left Ear: Tympanic membrane and ear canal normal.  Nose: Mucosal edema present. No rhinorrhea. No epistaxis.  Mouth/Throat: Oropharynx is clear and moist and mucous membranes are normal. No oropharyngeal exudate, posterior oropharyngeal edema or posterior oropharyngeal erythema.  Neck: Neck supple.  Cardiovascular: Normal rate, S1 normal and S2 normal.   No murmur heard. Pulmonary/Chest: Effort normal. She has no wheezes. She has no rhonchi. She has no rales.  Lymphadenopathy:    She has no cervical adenopathy.   Diagnostics: Spirometry:  FVC 3.09--125%, FEV1 3.09--125%.    [name] M. Willa Rough, MD  cc: Katy Apo, MD

## 2015-10-26 NOTE — Patient Instructions (Signed)
   Continue current  Medication regime.  Follow-up in 6 months or sooner if needed.

## 2015-11-06 ENCOUNTER — Telehealth: Payer: Self-pay | Admitting: Neurology

## 2015-11-06 NOTE — Telephone Encounter (Signed)
Spoke to pt. Form completed and placed at front desk for pickup.

## 2015-11-06 NOTE — Telephone Encounter (Signed)
[name] 06/15/1980 [phone].  Her FMLA  Paper work needs to be emailed to her for her work. Her e mail [email or web address] Thank you

## 2016-01-01 ENCOUNTER — Other Ambulatory Visit: Payer: Self-pay

## 2016-01-01 MED ORDER — TOPIRAMATE 100 MG PO TABS: 100.0000 mg | ORAL_TABLET | Freq: Every day | ORAL | 1 refills | Status: DC

## 2016-01-15 ENCOUNTER — Ambulatory Visit (INDEPENDENT_AMBULATORY_CARE_PROVIDER_SITE_OTHER): Payer: 59 | Admitting: Neurology

## 2016-01-15 ENCOUNTER — Encounter: Payer: Self-pay | Admitting: Neurology

## 2016-01-15 VITALS — BP 122/74 | HR 143 | Ht 64.0 in | Wt 190.0 lb

## 2016-01-15 DIAGNOSIS — G43009 Migraine without aura, not intractable, without status migrainosus: Secondary | ICD-10-CM | POA: Diagnosis not present

## 2016-01-15 DIAGNOSIS — J069 Acute upper respiratory infection, unspecified: Secondary | ICD-10-CM

## 2016-01-15 NOTE — Progress Notes (Signed)
NEUROLOGY FOLLOW UP OFFICE NOTE  Makylah Bossard [id number]  HISTORY OF PRESENT ILLNESS: [name] is a 35 year old right-handed female asthma and back pain who follows up for migraine.   UPDATE: Overall, she is doing better in regards to headaches.  She has about mild-moderate headache 2 to 3 days per week.  They usually respond to Tylenol (first line) or Relpax (2nd line).  For the past 4 days, she has had head congestion, headache, and runny nose.  She says she gets it once a year around this time and usually is treated with Mucinex, Benadryl and antibiotics.   Current NSAIDS:  meloxicam 7.5mg  (for back pain as well as headache) Current analgesics: tylenol Current triptans:  Relpax 20mg  Current anti-emetic:  none Current muscle relaxants:  cyclobenzaprine 10mg  Current anti-anxiolytic:  none Current sleep aide:  none Current Antihypertensive medications:  none Current Antidepressant medications:  none Current Anticonvulsant medications:  topiramate 100mg  Current Vitamins/Herbal/Supplements:  MVI, C, caltrate-D Current Antihistamines/Decongestants:  Flonase, Zyrtec Other therapy:  none  BMP from July demonstrates Na 140, K 4.4, BUN 11, Cr 0.70.   HISTORY: Onset:  Approximately 2010 Location:  Back of head and neck and frontal  Quality:  throbbing Initial Intensity:  10/10; July: mild 5-6/10, severe 9-10/10 Aura:  no Prodrome:  no Associated symptoms:  Photophobia, phonophobia, osmophobia, blurred vision.  No nausea Initial Duration:  1 to 2 hours with medication (cannot remember how long without medication); July: mild 30 minutes. Frequency:  6-8 days per month, worse during the Spring; July: mild 3-4 days per week, severe 2-3 days per month. Triggers/exacerbating factors:  Stress, change in weather, menstrual cycle Relieving factors:  Laying down in dark, quiet room Activity:  Needs to lay down   A CT of head performed on 09/30/08 to evaluate headache was normal.     Past NSAIDS:  Ibuprofen, Aleve Past analgesics:  Tylenol, BC, Goody, Bupap Past abortive triptans:  sumatriptan 100mg  Past antihypertensive medications:  none Past antidepressant medications:  none Past anticonvulsant medications:  none Past vitamins/Herbal/Supplements: none  PAST MEDICAL HISTORY: Past Medical History:  Diagnosis Date  . Asthma   . Bladder infection   . History of chicken pox   . HPV in female   . Migraines   . Trichomonas     MEDICATIONS: Current Outpatient Prescriptions on File Prior to Visit  Medication Sig Dispense Refill  . albuterol (PROVENTIL HFA;VENTOLIN HFA) 108 (90 BASE) MCG/ACT inhaler Inhale 2 puffs into the lungs every 6 (six) hours as needed for wheezing or shortness of breath. 1 Inhaler 2  . Ascorbic Acid (VITAMIN C PO) Take 1 tablet by mouth daily.     . Cholecalciferol (VITAMIN D3) 50000 UNITS CAPS Take 1 capsule by mouth once a week. 20 capsule 0  . cyclobenzaprine (FLEXERIL) 5 MG tablet Take 5 mg by mouth 3 (three) times daily as needed for muscle spasms.    Marland Kitchen eletriptan (RELPAX) 20 MG tablet Take 1 tablet at onset of headache, may repeat in 2 hours. Do not use more than 3 times per week. 10 tablet 2  . esomeprazole (NEXIUM) 40 MG capsule Take 40 mg by mouth daily before breakfast. Reported on 06/26/2015    . fluticasone (FLONASE) 50 MCG/ACT nasal spray Place 2 sprays into the nose daily. Reported on 06/26/2015    . ketotifen (ZADITOR) 0.025 % ophthalmic solution 1 drop 2 (two) times daily.    Marland Kitchen loratadine (CLARITIN) 10 MG tablet Take 10 mg by mouth  daily. Reported on 06/26/2015    . meloxicam (MOBIC) 7.5 MG tablet Take 7.5 mg by mouth daily.    . montelukast (SINGULAIR) 10 MG tablet Take 1 tablet (10 mg total) by mouth at bedtime. 30 tablet 5  . ranitidine (ZANTAC) 300 MG capsule Take 300 mg by mouth every evening.    . topiramate (TOPAMAX) 100 MG tablet Take 1 tablet (100 mg total) by mouth at bedtime. 90 tablet 1   No current  facility-administered medications on file prior to visit.     ALLERGIES: Allergies  Allergen Reactions  . Codeine Anaphylaxis  . Shellfish Allergy Anaphylaxis  . Latex Hives    FAMILY HISTORY: Family History  Problem Relation Age of Onset  . Hypertension Other   . Cancer Other   . Asthma Brother     SOCIAL HISTORY: Social History   Social History  . Marital status: Single    Spouse name: N/A  . Number of children: N/A  . Years of education: N/A   Occupational History  . Not on file.   Social History Main Topics  . Smoking status: Never Smoker  . Smokeless tobacco: Never Used  . Alcohol use Yes     Comment: occasional  . Drug use: No  . Sexual activity: Yes    Birth control/ protection: Condom   Other Topics Concern  . Not on file   Social History Narrative  . No narrative on file    REVIEW OF SYSTEMS: Constitutional: No fevers, chills, or sweats, no generalized fatigue, change in appetite Eyes: No visual changes, double vision, eye pain Ear, nose and throat: nasal congestion Cardiovascular: No chest pain, palpitations Respiratory:  No shortness of breath at rest or with exertion, wheezes GastrointestinaI: No nausea, vomiting, diarrhea, abdominal pain, fecal incontinence Genitourinary:  No dysuria, urinary retention or frequency Musculoskeletal:  No neck pain, back pain Integumentary: No rash, pruritus, skin lesions Neurological: as above Psychiatric: No depression, insomnia, anxiety Endocrine: No palpitations, fatigue, diaphoresis, mood swings, change in appetite, change in weight, increased thirst Hematologic/Lymphatic:  No purpura, petechiae. Allergic/Immunologic: no itchy/runny eyes, nasal congestion, recent allergic reactions, rashes  PHYSICAL EXAM: Vitals:   01/15/16 0925  BP: 122/74  Pulse: (!) 143   General: No acute distress.  Patient appears well-groomed.   Head:  Normocephalic/atraumatic Eyes:  Fundi examined but not visualized Neck:  supple, no paraspinal tenderness, full range of motion Heart:  Regular rate and rhythm Lungs:  Clear to auscultation bilaterally Back: No paraspinal tenderness Neurological Exam: alert and oriented to person, place, and time. Attention span and concentration intact, recent and remote memory intact, fund of knowledge intact.  Speech fluent and not dysarthric, language intact.  CN II-XII intact. Bulk and tone normal, muscle strength 5/5 throughout.  Sensation to light touch  intact.  Deep tendon reflexes 2+ throughout.  Finger to nose testing intact.  Gait normal  IMPRESSION: Migraine Upper respiratory infection with elevated heart rate  PLAN: We will continue current regimen for now.  I advised her to contact Dr. Idelle CrouchPolite's office for evaluation and treatment of her upper respiratory symptoms. Follow up in 6 months.  15 minutes spent face to face with patient, over 50% spent counseling.  CC:  Renford Dillsonald Polite, MD

## 2016-01-15 NOTE — Patient Instructions (Signed)
Continue current regimen but I would contact Dr. Idelle CrouchPolite's office for treatment of the cold. Follow up in 6 months.

## 2016-01-15 NOTE — Progress Notes (Signed)
Chart forwarded.  

## 2016-01-20 ENCOUNTER — Encounter (HOSPITAL_COMMUNITY): Payer: Self-pay | Admitting: Obstetrics and Gynecology

## 2016-03-15 ENCOUNTER — Other Ambulatory Visit: Payer: Self-pay | Admitting: *Deleted

## 2016-03-15 MED ORDER — MONTELUKAST SODIUM 10 MG PO TABS: 10.0000 mg | ORAL_TABLET | Freq: Every day | ORAL | 0 refills | Status: AC

## 2016-03-15 MED ORDER — MONTELUKAST SODIUM 10 MG PO TABS
10.0000 mg | ORAL_TABLET | Freq: Every day | ORAL | 0 refills | Status: DC
Start: 2016-03-15 — End: 2016-03-15

## 2016-03-15 NOTE — Addendum Note (Signed)
Addended by: Bennye AlmMIRANDA, Analina Filla on: 03/15/2016 11:08 AM   Modules accepted: Orders

## 2016-04-15 ENCOUNTER — Telehealth: Payer: Self-pay | Admitting: Neurology

## 2016-04-15 NOTE — Telephone Encounter (Signed)
[name] 10/08/1980. Her #[phone]. She uses [facility] on [address]. The medication is Relpax (reactive medication). She was told it would be $ 400. She needs some thing less expensive but that will do the same thing. Thank you

## 2016-04-15 NOTE — Telephone Encounter (Signed)
Spoke to patient. She says she switched insurances now Relpax will cost more. Requesting cheaper alternative.

## 2016-04-17 NOTE — Telephone Encounter (Signed)
Recommend Maxalt 10mg  at earliest onset of migraine, may repeat once after 2 hours if needed (not to exceed 2 tablets in 24 hours)

## 2016-04-18 NOTE — Telephone Encounter (Signed)
Called patient.  No answer.

## 2016-04-19 NOTE — Telephone Encounter (Signed)
Called patient.  No answer.

## 2016-07-11 ENCOUNTER — Other Ambulatory Visit: Payer: Self-pay | Admitting: Neurology

## 2016-07-15 ENCOUNTER — Ambulatory Visit: Payer: Self-pay | Admitting: Neurology

## 2016-10-03 ENCOUNTER — Other Ambulatory Visit: Payer: Self-pay | Admitting: Neurology

## 2017-08-12 ENCOUNTER — Other Ambulatory Visit: Payer: Self-pay | Admitting: Neurology

## 2017-08-17 ENCOUNTER — Other Ambulatory Visit: Payer: Self-pay | Admitting: Neurology

## 2017-11-12 ENCOUNTER — Encounter (HOSPITAL_COMMUNITY): Payer: Self-pay | Admitting: Emergency Medicine

## 2017-11-12 ENCOUNTER — Emergency Department (HOSPITAL_COMMUNITY): Payer: BLUE CROSS/BLUE SHIELD

## 2017-11-12 ENCOUNTER — Emergency Department (HOSPITAL_COMMUNITY)
Admission: EM | Admit: 2017-11-12 | Discharge: 2017-11-12 | Disposition: A | Discharge status: Home / Self Care | Payer: BLUE CROSS/BLUE SHIELD | Attending: Emergency Medicine | Admitting: Emergency Medicine

## 2017-11-12 DIAGNOSIS — K219 Gastro-esophageal reflux disease without esophagitis: Secondary | ICD-10-CM | POA: Diagnosis not present

## 2017-11-12 DIAGNOSIS — J4520 Mild intermittent asthma, uncomplicated: Secondary | ICD-10-CM | POA: Insufficient documentation

## 2017-11-12 DIAGNOSIS — Z91040 Latex allergy status: Secondary | ICD-10-CM | POA: Insufficient documentation

## 2017-11-12 DIAGNOSIS — Z79899 Other long term (current) drug therapy: Secondary | ICD-10-CM | POA: Diagnosis not present

## 2017-11-12 DIAGNOSIS — R202 Paresthesia of skin: Secondary | ICD-10-CM

## 2017-11-12 NOTE — ED Provider Notes (Signed)
[facility]-EMERGENCY DEPT Provider Note   CSN: [id number] Arrival date & time: 11/12/17  0451    History   Chief Complaint Chief Complaint  Patient presents with  . Gastroesophageal Reflux    HPI [name] is a 37 y.o. female.   37 year old female with a history of asthma, migraines presents to the emergency department for multiple complaints.  She reports 3-4 days of paresthesias to her RUE and RLE. These are intermittent and without modifying factors. She has noted some pain to the right side of her chest with this. Pain is nonradiating and she has not taken any medications for these symptoms. The patient notes that she feels that she is experiencing trouble breathing at times, but this is secondary to a sensation that secretions are stuck in her oropharynx and occluding her mouth. She does NOT have feelings of dyspnea when breathing through her nose. She has had no inability to swallow, drooling, fevers, vomiting, congestion, PND. Endorses hx of GERD, but denies "sour" taste in the back of her throat. No hx of DM, HTN, HLD.     Past Medical History:  Diagnosis Date  . Asthma   . Bladder infection   . History of chicken pox   . HPV in female   . Migraines   . Trichomonas     Patient Active Problem List   Diagnosis Date Noted  . Pelvic pain in female 04/20/2012  . Migraines   . Bladder infection   . History of chicken pox   . ACUTE SINUSITIS, UNSPECIFIED 10/13/2008  . RLQ PAIN 01/23/2008  . BACK PAIN 08/10/2007  . PLANTAR WART 06/07/2007  . ASTHMA, INTERMITTENT, MILD 06/07/2007  . DIZZINESS 06/07/2007  . ALLERGIC RHINITIS CAUSE UNSPECIFIED 05/14/2007  . RASH AND OTHER NONSPECIFIC SKIN ERUPTION 05/14/2007  . 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 ACNE VULGARIS, FACIAL, MILD 05/19/2006  . FOLLICULITIS 05/18/2006  . ABNORMAL FINDINGS, PAP SMEAR, OTHER SITE 03/31/2006    Past Surgical History:  Procedure Laterality Date  . CERVICAL CONE  BIOPSY    . DILATION AND CURETTAGE OF UTERUS    . WISDOM TOOTH EXTRACTION       OB History    Gravida  1   Para      Term      Preterm      AB  1   Living        SAB  1   TAB      Ectopic      Multiple      Live Births               Home Medications    Prior to Admission medications   Medication Sig Start Date End Date Taking? Authorizing Provider  albuterol (PROVENTIL HFA;VENTOLIN HFA) 108 (90 BASE) MCG/ACT inhaler Inhale 2 puffs into the lungs every 6 (six) hours as needed for wheezing or shortness of breath. 12/29/14   Baxter HireHicks, Roselyn M, MD  Ascorbic Acid (VITAMIN C PO) Take 1 tablet by mouth daily.     [provider]  Cholecalciferol (VITAMIN D3) 50000 UNITS CAPS Take 1 capsule by mouth once a week. 04/23/12   Osborn Cohooberts, Angela, MD  cyclobenzaprine (FLEXERIL) 5 MG tablet Take 5 mg by mouth 3 (three) times daily as needed for muscle spasms.    [provider]  eletriptan (RELPAX) 20 MG tablet Take 1 tablet at onset of headache, may repeat in 2 hours. Do not use more than 3 times per week.  10/09/15   Drema Dallas, DO  esomeprazole (NEXIUM) 40 MG capsule Take 40 mg by mouth daily before breakfast. Reported on 06/26/2015    [provider]  fluticasone (FLONASE) 50 MCG/ACT nasal spray Place 2 sprays into the nose daily. Reported on 06/26/2015    [provider]  ketotifen (ZADITOR) 0.025 % ophthalmic solution 1 drop 2 (two) times daily.    [provider]  loratadine (CLARITIN) 10 MG tablet Take 10 mg by mouth daily. Reported on 06/26/2015    [provider]  meloxicam (MOBIC) 7.5 MG tablet Take 7.5 mg by mouth daily.    [provider]  montelukast (SINGULAIR) 10 MG tablet Take 1 tablet (10 mg total) by mouth at bedtime. 03/15/16   Alfonse Spruce, MD  ranitidine (ZANTAC) 300 MG capsule Take 300 mg by mouth every evening.    [provider]  topiramate (TOPAMAX) 100 MG tablet TAKE 1 TABLET (100  MG TOTAL) BY MOUTH AT BEDTIME. 07/11/16   Drema Dallas, DO    Family History Family History  Problem Relation Age of Onset  . Hypertension Other   . Cancer Other   . Asthma Brother     Social History Social History   Tobacco Use  . Smoking status: Never Smoker  . Smokeless tobacco: Never Used  Substance Use Topics  . Alcohol use: Yes    Comment: occasional  . Drug use: No     Allergies   Codeine; Shellfish allergy; and Latex   Review of Systems Review of Systems Ten systems reviewed and are negative for acute change, except as noted in the HPI.    Physical Exam Updated Vital Signs BP 127/82 (BP Location: Right Arm)   Temp 97.9 F (36.6 C) (Oral)   Resp 16   SpO2 100%   Physical Exam  Constitutional: She is oriented to person, place, and time. She appears well-developed and well-nourished. No distress.  Nontoxic appearing and in NAD  HENT:  Head: Normocephalic and atraumatic.  Eyes: Conjunctivae and EOM are normal. No scleral icterus.  Neck: Normal range of motion.  Pulmonary/Chest: Effort normal. No respiratory distress.  Respirations even and unlabored. SpO2 99% on room air.  Musculoskeletal: Normal range of motion.  Neurological: She is alert and oriented to person, place, and time. No cranial nerve deficit. She exhibits normal muscle tone. Coordination normal.  GCS 15. Speech is goal oriented. No cranial nerve deficits appreciated; symmetric eyebrow raise, no facial drooping, tongue midline. Patient has equal grip strength bilaterally with 5/5 strength against resistance in all major muscle groups bilaterally. Sensation to light touch intact. Patient moves extremities without ataxia.  Skin: Skin is warm and dry. No rash noted. She is not diaphoretic. No erythema. No pallor.  Psychiatric: Her speech is normal and behavior is normal. Her mood appears anxious.  Bizarre affect.  Nursing note and vitals reviewed.    ED Treatments / Results  Labs (all labs  ordered are listed, but only abnormal results are displayed) Labs Reviewed - No data to display  EKG None  Radiology Ct Head Wo Contrast  Result Date: 11/12/2017 CLINICAL DATA:  Complains of gastroesophageal reflux disease with anxiety. Paresthesias. EXAM: CT HEAD WITHOUT CONTRAST TECHNIQUE: Contiguous axial images were obtained from the base of the skull through the vertex without intravenous contrast. COMPARISON:  09/30/2008 FINDINGS: Brain: No evidence of acute infarction, hemorrhage, hydrocephalus, extra-axial collection or mass lesion/mass effect. Vascular: No hyperdense vessel or unexpected calcification. Skull: Calvarium appears intact.  Sinuses/Orbits: Paranasal sinuses and mastoid air cells are clear. Other: None. IMPRESSION: Normal head CT. Electronically Signed   By: Burman NievesWilliam  Stevens M.D.   On: 11/12/2017 06:18    Procedures Procedures (including critical care time)  Medications Ordered in ED Medications - No data to display   5:15 AM Patient with bizarre affect. I arrived into the room with the patient lying on the bed, looking at the ceiling, with her mouth wide open. She will not initially answer any of my questions; will stare back at me. When encouraged to participate in her encounter, she will intermittently glance at different areas in the room as though she is anxious.  At one time, she proceeds to open her mouth and keep her tongue protruded.  When asked why she has decided to do this, she states that she feels as though her saliva is stuck in her mouth.  5:45 AM Asked by nurse to go and assess patient over concerns about a "spot on her butt". Patient assessed again.  States that she was seen at her primary office earlier in the week where she was given an IM injection.  She states that she has had tenderness to the area as well as swelling, "but it is only noticeable when I push around it a certain way".  There is no pustule, induration, erythema, heat to touch.  No  lymphangitic streaking.  No concern for cellulitis or abscess on exam.  6:41 AM Patient reassessed.  Resting comfortably.  Heart rate 88 bpm.  Oxygen saturations 100% on room air.  Conveyed the reassuring results of her CT head imaging.  She verbalizes understanding.  She expresses desire for work-up regarding pulmonary embolus.  Based on history and exam, she is PERC negative today.  The patient is appropriate for follow up with her primary care provider.   Initial Impression / Assessment and Plan / ED Course  I have reviewed the triage vital signs and the nursing notes.  Pertinent labs & imaging results that were available during my care of the patient were reviewed by me and considered in my medical decision making (see chart for details).      37 year old female presents to the emergency department for evaluation of multiple complaints.  She is mostly expressed concern over paresthesias in her right upper and lower extremities over the past 3 to 4 days.  These are intermittent.  On chart review she does have history of migraine headaches, but denies any headaches associated with her paresthesias.  Patient also noting right-sided chest discomfort which is intermittent along with trouble breathing at times; however, trouble breathing is secondary to a sensation that secretions are stuck in her oropharynx and occluding her mouth.  Respirations are even and unlabored on exam.  She has no hypoxia.  No signs of respiratory distress, tachypnea, dyspnea.  Patient noting concern for pulmonary embolus.  I have little concern for PE today given that the patient is PERC negative.  With respect to her paresthesias, she has a nonfocal neurologic examination.  A head CT was performed which is negative for acute intracranial process.  I suspect there is an underlying component of anxiety associated with her symptoms today.  I believe she would benefit from further follow-up with her primary care doctor.  No  indication for further work-up at this time.  Return precautions discussed and provided. Patient discharged in stable condition with no unaddressed concerns.   Final Clinical Impressions(s) / ED Diagnoses   Final diagnoses:  Paresthesia of right upper and lower extremity    ED Discharge Orders    None       Antony Madura, PA-C 11/12/17 1610    Gilda Crease, MD 11/12/17 [phone]

## 2017-11-12 NOTE — ED Notes (Signed)
Bed: WA17 Expected date:  Expected time:  Means of arrival:  Comments: EMS 37 yo female GERDS

## 2017-11-12 NOTE — Discharge Instructions (Addendum)
Your CT scan today did not show evidence of mass, stroke, bleed, hydrocephalus.  We advised that you continue your daily medications.  Follow-up with your primary care doctor for further evaluation of your symptoms.  You may also benefit from follow-up with your prior neurologist.

## 2017-11-12 NOTE — ED Triage Notes (Addendum)
Pt comes to ed, c/o of of GERD with anxiety. Pt alert x 4  cbg 80 , bp 128/92, pulse 98, rr18. Comes from home.

## 2019-03-29 IMAGING — CT CT HEAD W/O CM
3 series · 16 of 47 positions shown, 19 images · non-contrast
Comparison: 09/30/2008

CLINICAL DATA: Complains of gastroesophageal reflux disease with
anxiety. Paresthesias.

EXAM:
CT HEAD WITHOUT CONTRAST
TECHNIQUE: Contiguous axial images were obtained from the base of the skull
through the vertex without intravenous contrast.

[Series 2: head wo · axial · 0.42mm/px · z∈[-138,-2]mm · 10 of 33 slices shown, 13 images]
[im 3/33  brain]
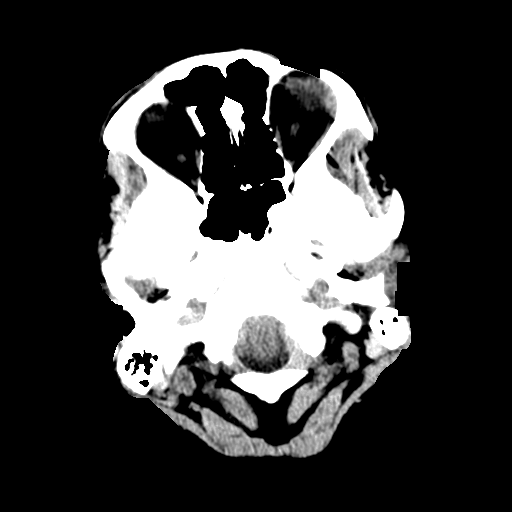
[im 3/33  bone]
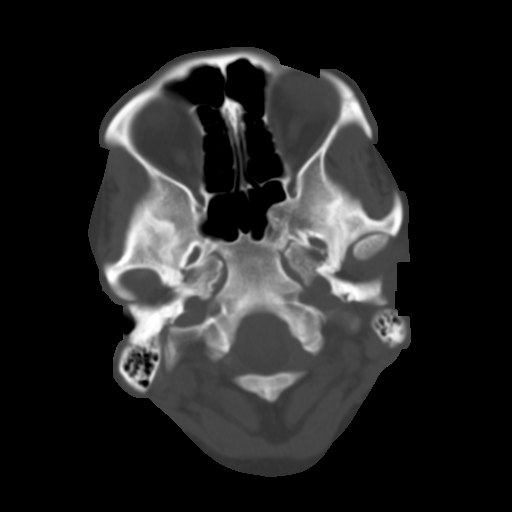
[im 6/33  brain]
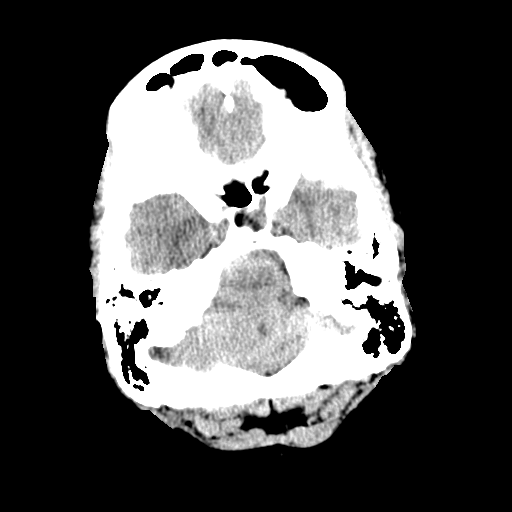
[im 9/33  brain]
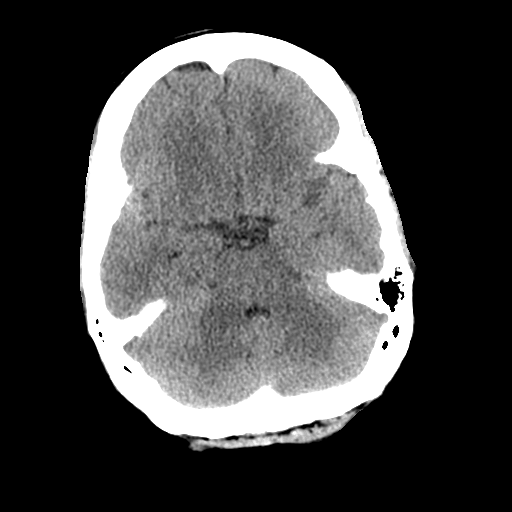
[im 12/33  brain]
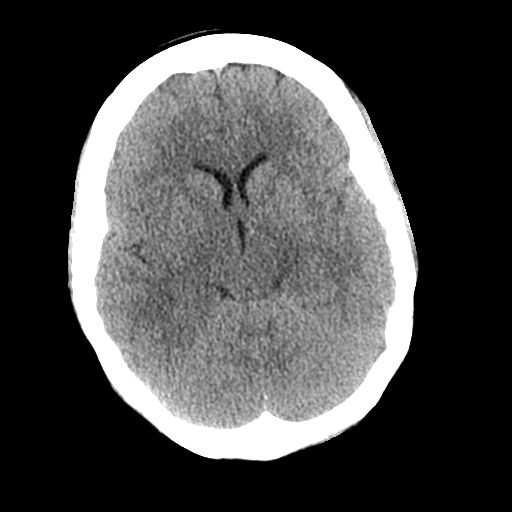
[im 15/33  brain]
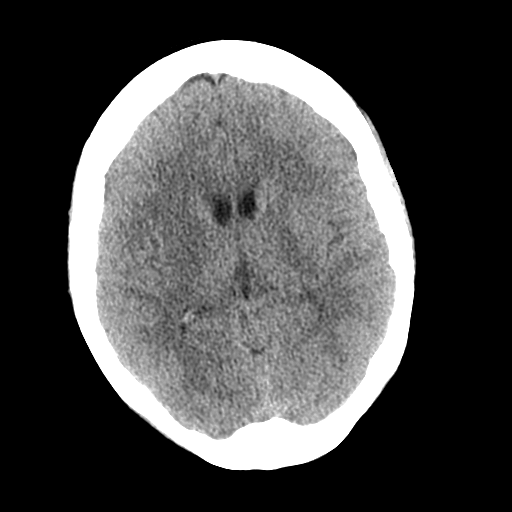
[im 15/33  bone]
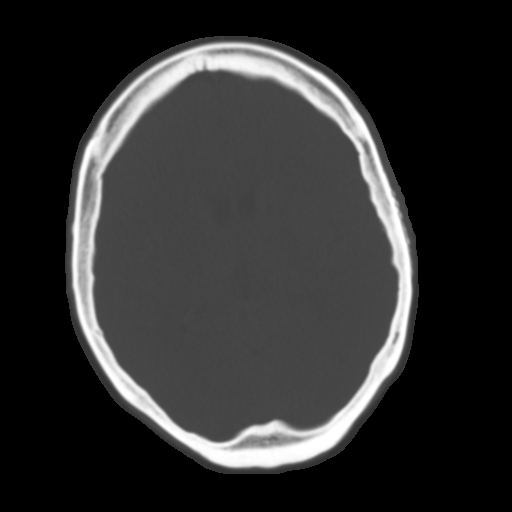
[im 18/33  brain]
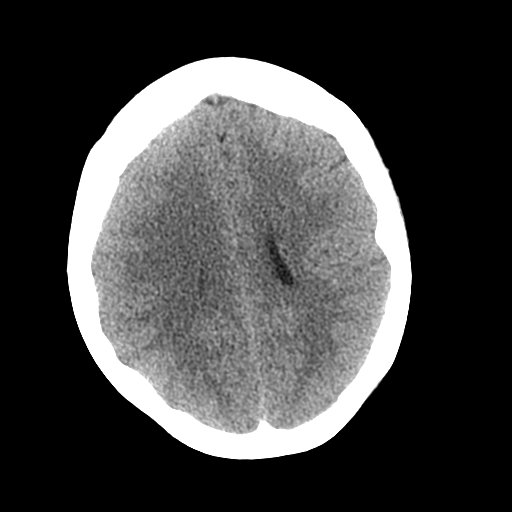
[im 21/33  brain]
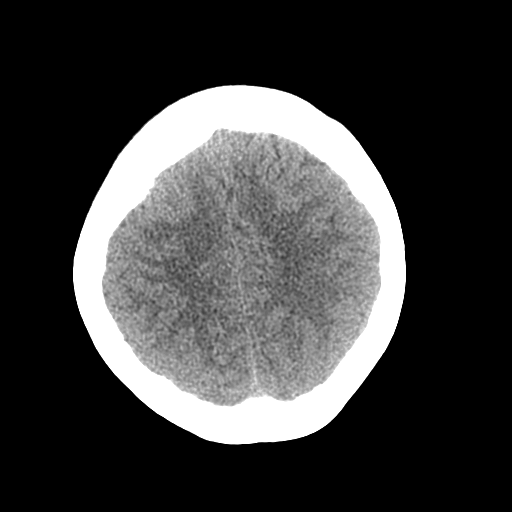
[im 25/33  brain]
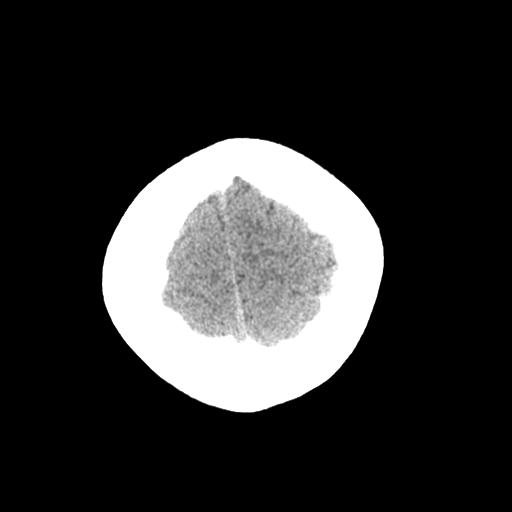
[im 27/33  brain]
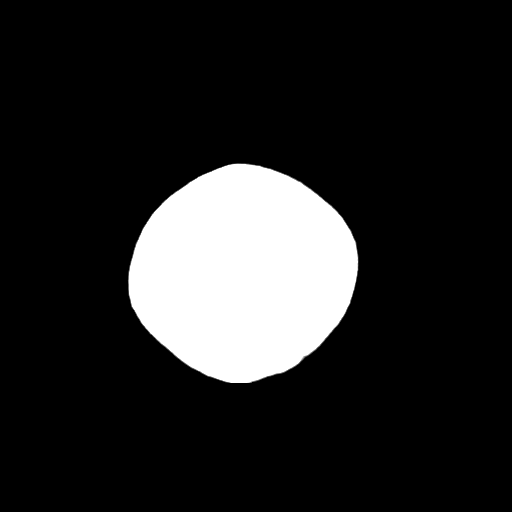
[im 27/33  bone]
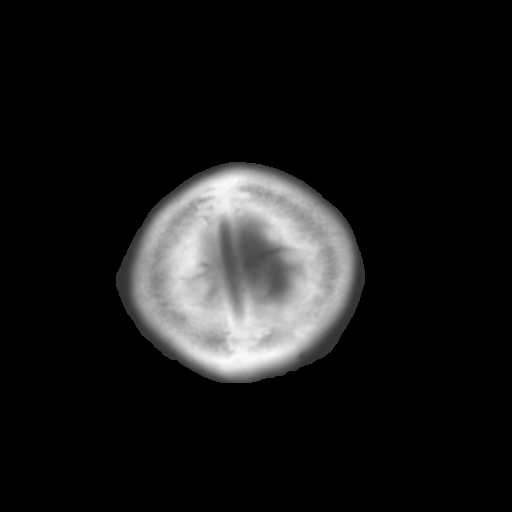
[im 30/33  brain]
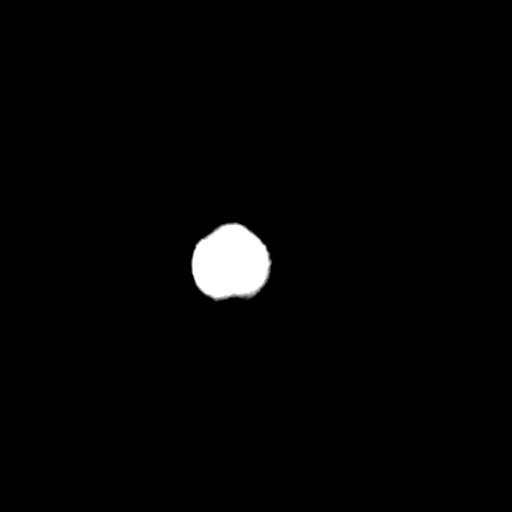

[Series 5: coronal soft tissue · coronal · 0.32mm/px · 3 of 69 slices shown]
[im 23/69  brain]
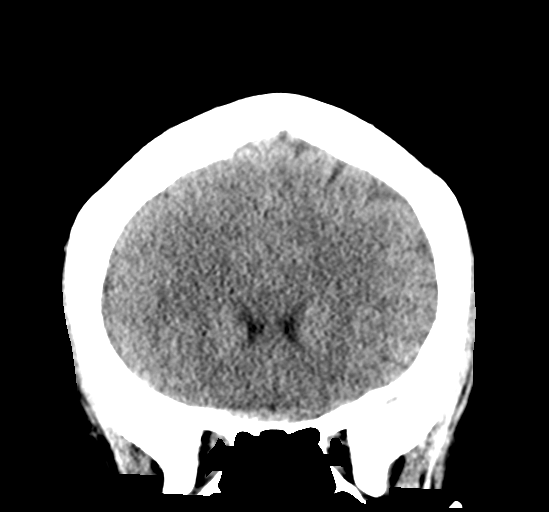
[im 31/69  brain]
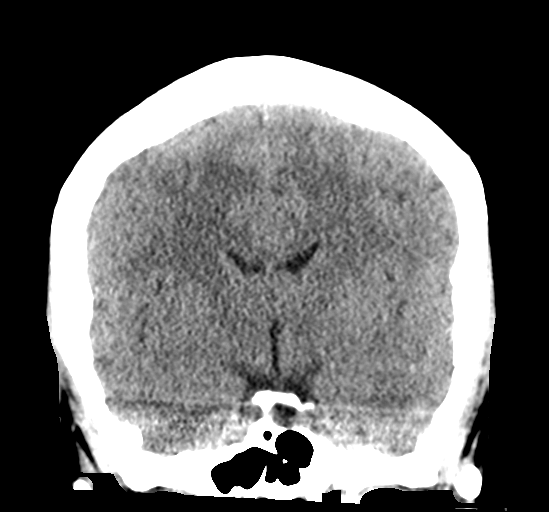
[im 38/69  brain]
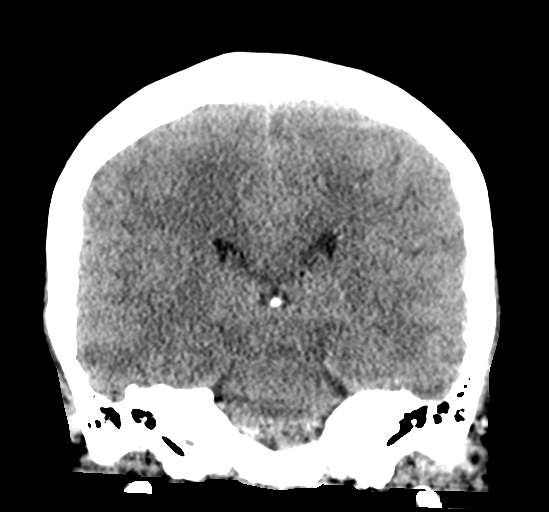

[Series 6: sagittal soft tissue · sagittal · 0.32mm/px · 3 of 60 slices shown]
[im 20/60  brain]
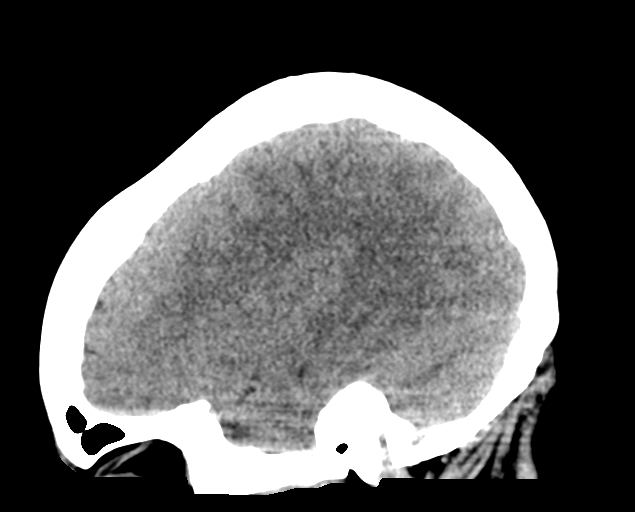
[im 30/60  brain]
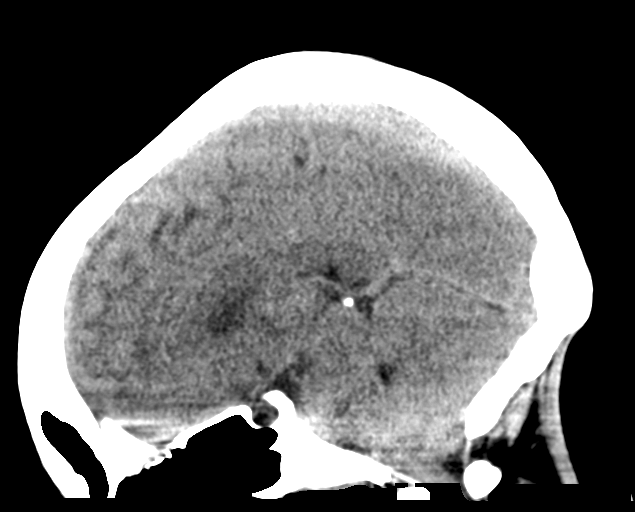
[im 40/60  brain]
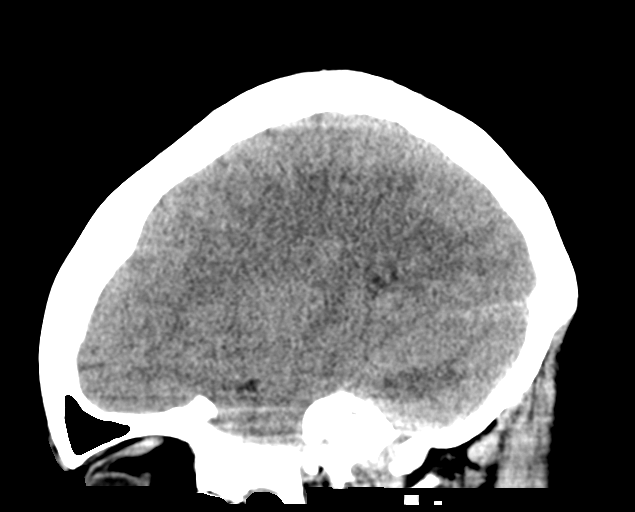

[16 of 47 positions shown; findings below may reference images not displayed]

FINDINGS: Brain: No evidence of acute infarction, hemorrhage, hydrocephalus,
extra-axial collection or mass lesion/mass effect.

Vascular: No hyperdense vessel or unexpected calcification.

Skull: Calvarium appears intact.

Sinuses/Orbits: Paranasal sinuses and mastoid air cells are clear.

Other: None.
IMPRESSION: Normal head CT.
